# Patient Record
Sex: Female | Born: 1955 | Race: White | Hispanic: No | Smoking: Never smoker
Health system: Southern US, Community
[De-identification: ages and names within clinical notes are randomized; demographics above are authoritative.]

## PROBLEM LIST (undated history)

## (undated) DIAGNOSIS — C801 Malignant (primary) neoplasm, unspecified: Secondary | ICD-10-CM

## (undated) DIAGNOSIS — E119 Type 2 diabetes mellitus without complications: Secondary | ICD-10-CM

## (undated) DIAGNOSIS — E7800 Pure hypercholesterolemia, unspecified: Secondary | ICD-10-CM

## (undated) DIAGNOSIS — I10 Essential (primary) hypertension: Secondary | ICD-10-CM

## (undated) DIAGNOSIS — D649 Anemia, unspecified: Secondary | ICD-10-CM

## (undated) DIAGNOSIS — T753XXA Motion sickness, initial encounter: Secondary | ICD-10-CM

## (undated) DIAGNOSIS — I499 Cardiac arrhythmia, unspecified: Secondary | ICD-10-CM

## (undated) DIAGNOSIS — K859 Acute pancreatitis without necrosis or infection, unspecified: Secondary | ICD-10-CM

## (undated) DIAGNOSIS — K219 Gastro-esophageal reflux disease without esophagitis: Secondary | ICD-10-CM

## (undated) HISTORY — PX: ACHILLES TENDON REPAIR: SUR1153

## (undated) HISTORY — PX: CARPAL TUNNEL RELEASE: SHX101

## (undated) HISTORY — PX: BREAST BIOPSY: SHX20

## (undated) HISTORY — PX: CATARACT EXTRACTION, BILATERAL: SHX1313

## (undated) HISTORY — PX: OTHER SURGICAL HISTORY: SHX169

## (undated) HISTORY — PX: BREAST EXCISIONAL BIOPSY: SUR124

## (undated) HISTORY — PX: TONSILLECTOMY: SUR1361

---

## 2004-12-15 ENCOUNTER — Ambulatory Visit: Payer: Self-pay

## 2006-01-09 ENCOUNTER — Ambulatory Visit: Payer: Self-pay | Admitting: Obstetrics and Gynecology

## 2007-01-17 ENCOUNTER — Ambulatory Visit: Payer: Self-pay | Admitting: Obstetrics and Gynecology

## 2008-01-29 ENCOUNTER — Ambulatory Visit: Payer: Self-pay | Admitting: Obstetrics and Gynecology

## 2009-04-16 ENCOUNTER — Ambulatory Visit: Payer: Self-pay | Admitting: Obstetrics and Gynecology

## 2009-04-23 ENCOUNTER — Ambulatory Visit: Payer: Self-pay | Admitting: Obstetrics and Gynecology

## 2009-11-03 ENCOUNTER — Ambulatory Visit: Payer: Self-pay | Admitting: Obstetrics and Gynecology

## 2010-04-22 ENCOUNTER — Ambulatory Visit: Payer: Self-pay | Admitting: Obstetrics and Gynecology

## 2010-06-23 ENCOUNTER — Ambulatory Visit: Payer: Self-pay | Admitting: Anesthesiology

## 2010-06-28 ENCOUNTER — Ambulatory Visit: Payer: Self-pay | Admitting: Unknown Physician Specialty

## 2011-05-05 ENCOUNTER — Ambulatory Visit: Payer: Self-pay | Admitting: Obstetrics and Gynecology

## 2012-05-08 ENCOUNTER — Ambulatory Visit: Payer: Self-pay | Admitting: Obstetrics and Gynecology

## 2013-05-21 ENCOUNTER — Ambulatory Visit: Payer: Self-pay | Admitting: Obstetrics and Gynecology

## 2013-07-19 DIAGNOSIS — I10 Essential (primary) hypertension: Secondary | ICD-10-CM | POA: Insufficient documentation

## 2013-07-19 DIAGNOSIS — E7800 Pure hypercholesterolemia, unspecified: Secondary | ICD-10-CM | POA: Insufficient documentation

## 2013-07-19 DIAGNOSIS — E119 Type 2 diabetes mellitus without complications: Secondary | ICD-10-CM | POA: Insufficient documentation

## 2014-07-11 ENCOUNTER — Ambulatory Visit: Payer: Self-pay | Admitting: Obstetrics and Gynecology

## 2015-05-22 ENCOUNTER — Ambulatory Visit
Admission: EM | Admit: 2015-05-22 | Discharge: 2015-05-22 | Disposition: A | Discharge status: Home / Self Care | Payer: BC Managed Care – PPO | Attending: Family Medicine | Admitting: Family Medicine

## 2015-05-22 ENCOUNTER — Emergency Department
Admission: EM | Admit: 2015-05-22 | Discharge: 2015-05-22 | Disposition: A | Discharge status: Home / Self Care | Payer: BC Managed Care – PPO | Attending: Emergency Medicine | Admitting: Emergency Medicine

## 2015-05-22 ENCOUNTER — Encounter: Payer: Self-pay | Admitting: Emergency Medicine

## 2015-05-22 ENCOUNTER — Emergency Department: Payer: BC Managed Care – PPO

## 2015-05-22 DIAGNOSIS — K219 Gastro-esophageal reflux disease without esophagitis: Secondary | ICD-10-CM | POA: Insufficient documentation

## 2015-05-22 DIAGNOSIS — E119 Type 2 diabetes mellitus without complications: Secondary | ICD-10-CM | POA: Diagnosis not present

## 2015-05-22 DIAGNOSIS — R079 Chest pain, unspecified: Secondary | ICD-10-CM | POA: Diagnosis not present

## 2015-05-22 DIAGNOSIS — I10 Essential (primary) hypertension: Secondary | ICD-10-CM | POA: Diagnosis not present

## 2015-05-22 HISTORY — DX: Type 2 diabetes mellitus without complications: E11.9

## 2015-05-22 HISTORY — DX: Essential (primary) hypertension: I10

## 2015-05-22 HISTORY — DX: Gastro-esophageal reflux disease without esophagitis: K21.9

## 2015-05-22 LAB — CBC
HEMATOCRIT: 39.2 % (ref 35.0–47.0)
HEMOGLOBIN: 13.3 g/dL (ref 12.0–16.0)
MCH: 31.7 pg (ref 26.0–34.0)
MCHC: 34 g/dL (ref 32.0–36.0)
MCV: 93.1 fL (ref 80.0–100.0)
Platelets: 179 10*3/uL (ref 150–440)
RBC: 4.21 MIL/uL (ref 3.80–5.20)
RDW: 14.1 % (ref 11.5–14.5)
WBC: 7.1 10*3/uL (ref 3.6–11.0)

## 2015-05-22 LAB — BASIC METABOLIC PANEL
ANION GAP: 7 (ref 5–15)
BUN: 19 mg/dL (ref 6–20)
CHLORIDE: 105 mmol/L (ref 101–111)
CO2: 26 mmol/L (ref 22–32)
Calcium: 9.3 mg/dL (ref 8.9–10.3)
Creatinine, Ser: 0.71 mg/dL (ref 0.44–1.00)
GFR calc non Af Amer: >60 mL/min (ref >60)
GLUCOSE: 102 mg/dL — AB (ref 65–99)
Potassium: 3.5 mmol/L (ref 3.5–5.1)
Sodium: 138 mmol/L (ref 135–145)

## 2015-05-22 LAB — TROPONIN I: Troponin I: <0.03 ng/mL (ref <0.031)

## 2015-05-22 MED ORDER — ASPIRIN 81 MG PO CHEW
324.0000 mg | CHEWABLE_TABLET | Freq: Once | ORAL | Status: AC
  Administered 2015-05-22: 324 mg via ORAL

## 2015-05-22 MED ORDER — ASPIRIN 81 MG PO TBEC: 81.0000 mg | DELAYED_RELEASE_TABLET | Freq: Every day | ORAL | Status: AC

## 2015-05-22 NOTE — ED Provider Notes (Signed)
[facility] Emergency Department Provider Note     Time seen: ----------------------------------------- 4:17 PM on 05/22/2015 -----------------------------------------    I have reviewed the triage vital signs and the nursing notes.   HISTORY  Chief Complaint Chest Pain    HPI Nancy Fowler is a 60 y.o. female who presents ER with central chest pain that occurred today around noon and lasted 25 minutes. Patient states she's had recurring episodes like this for the last year but noticed since worsened. Patient states she had the pain twice yesterday and today's lasted longer than normal. Pain doesn't radiate, she has no other associated symptoms. She does take omeprazole daily.   Past Medical History  Diagnosis Date  . Diabetes mellitus without complication ([place])   . Hypertension   . GERD (gastroesophageal reflux disease)     There are no active problems to display for this patient.   History reviewed. No pertinent past surgical history.  Allergies Review of patient's allergies indicates no known allergies.  Social History Social History  Substance Use Topics  . Smoking status: Never Smoker   . Smokeless tobacco: None  . Alcohol Use: No    Review of Systems Constitutional: Negative for fever. Eyes: Negative for visual changes. ENT: Negative for sore throat. Cardiovascular: Positive for chest pain Respiratory: Negative for shortness of breath. Gastrointestinal: Negative for abdominal pain, vomiting and diarrhea. Genitourinary: Negative for dysuria. Musculoskeletal: Negative for back pain. Skin: Negative for rash. Neurological: Negative for headaches, focal weakness or numbness.  10-point ROS otherwise negative.  ____________________________________________   PHYSICAL EXAM:  VITAL SIGNS: ED Triage Vitals  Enc Vitals Group     BP 05/22/15 1425 138/61 mmHg     Pulse Rate 05/22/15 1425 76     Resp 05/22/15 1425 18     Temp  05/22/15 1425 98 F (36.7 C)     Temp Source 05/22/15 1425 Oral     SpO2 05/22/15 1425 97 %     Weight 05/22/15 1425 200 lb (90.719 kg)     Height 05/22/15 1425 5\' 4"  (1.626 m)     Head Cir --      Peak Flow --      Pain Score 05/22/15 1436 0     Pain Loc --      Pain Edu? --      Excl. in [place]? --     Constitutional: Alert and oriented. Well appearing and in no distress. Eyes: Conjunctivae are normal. PERRL. Normal extraocular movements. ENT   Head: Normocephalic and atraumatic.   Nose: No congestion/rhinnorhea.   Mouth/Throat: Mucous membranes are moist.   Neck: No stridor. Cardiovascular: Normal rate, regular rhythm. Normal and symmetric distal pulses are present in all extremities. No murmurs, rubs, or gallops. Respiratory: Normal respiratory effort without tachypnea nor retractions. Breath sounds are clear and equal bilaterally. No wheezes/rales/rhonchi. Gastrointestinal: Soft and nontender. No distention. No abdominal bruits.  Musculoskeletal: Nontender with normal range of motion in all extremities. No joint effusions.  No lower extremity tenderness nor edema. Neurologic:  Normal speech and language. No gross focal neurologic deficits are appreciated. Speech is normal. No gait instability. Skin:  Skin is warm, dry and intact. No rash noted. Psychiatric: Mood and affect are normal. Speech and behavior are normal. Patient exhibits appropriate insight and judgment. ____________________________________________  EKG: Interpreted by me. Normal sinus rhythm with a rate of 69 bpm, normal PR interval, normal QRS, normal QT interval. Normal axis.  ____________________________________________  ED COURSE:  Pertinent labs &  imaging results that were available during my care of the patient were reviewed by me and considered in my medical decision making (see chart for details). Patient is in no acute distress, will check cardiac labs and x-ray and  reevaluate. ____________________________________________    LABS (pertinent positives/negatives)  Labs Reviewed  BASIC METABOLIC PANEL - Abnormal; Notable for the following:    Glucose, Bld 102 (*)    All other components within normal limits  CBC  TROPONIN I    RADIOLOGY  Chest x-ray is normal  ____________________________________________  FINAL ASSESSMENT AND PLAN  Chest pain  Plan: Patient with labs and imaging as dictated above. Patient is low risk according to heart score for ACS. We will advise her to double her omeprazole for the time being and had a baby aspirin daily so she can follow-up and have a stress test. She'll be referred to cardiology for outpatient follow-up.   Earleen Newport, MD   Earleen Newport, MD 05/22/15 1640

## 2015-05-22 NOTE — ED Notes (Signed)
Patient presents to the ED with central chest pain that occurred today around noon and lasted about 68minutes.  Patient states she has been having recurrent centralized chest pain for about a year but has noticed that frequency of chest pain has increased over the past few weeks.  Patient states she had the pain twice yesterday and today's episode lasted longer than usual.  Patient reports pain does not radiate and she does not have shortness of breath or diaphoresis, nausea, or vomiting alongside the pain.  Patient states she has thought the pain was heartburn in the past but has become concerned as episodes have increased.

## 2015-05-22 NOTE — Discharge Instructions (Signed)
Chest Pain Observation °It is often hard to give a specific diagnosis for the cause of chest pain. Among other possibilities your symptoms might be caused by inadequate oxygen delivery to your heart (angina). Angina that is not treated or evaluated can lead to a heart attack (myocardial infarction) or death. °Blood tests, electrocardiograms, and X-rays may have been done to help determine a possible cause of your chest pain. After evaluation and observation, your health care provider has determined that it is unlikely your pain was caused by an unstable condition that requires hospitalization. However, a full evaluation of your pain may need to be completed, with additional diagnostic testing as directed. It is very important to keep your follow-up appointments. Not keeping your follow-up appointments could result in permanent heart damage, disability, or death. If there is any problem keeping your follow-up appointments, you must call your health care provider. °HOME CARE INSTRUCTIONS  °Due to the slight chance that your pain could be angina, it is important to follow your health care provider's treatment plan and also maintain a healthy lifestyle: °· Maintain or work toward achieving a healthy weight. °· Stay physically active and exercise regularly. °· Decrease your salt intake. °· Eat a balanced, healthy diet. Talk to a dietitian to learn about heart-healthy foods. °· Increase your fiber intake by including whole grains, vegetables, fruits, and nuts in your diet. °· Avoid situations that cause stress, anger, or depression. °· Take medicines as advised by your health care provider. Report any side effects to your health care provider. Do not stop medicines or adjust the dosages on your own. °· Quit smoking. Do not use nicotine patches or gum until you check with your health care provider. °· Keep your blood pressure, blood sugar, and cholesterol levels within normal limits. °· Limit alcohol intake to no more than  1 drink per day for women who are not pregnant and 2 drinks per day for men. °· Do not abuse drugs. °SEEK IMMEDIATE MEDICAL CARE IF: °You have severe chest pain or pressure which may include symptoms such as: °· You feel pain or pressure in your arms, neck, jaw, or back. °· You have severe back or abdominal pain, feel sick to your stomach (nauseous), or throw up (vomit). °· You are sweating profusely. °· You are having a fast or irregular heartbeat. °· You feel short of breath while at rest. °· You notice increasing shortness of breath during rest, sleep, or with activity. °· You have chest pain that does not get better after rest or after taking your usual medicine. °· You wake from sleep with chest pain. °· You are unable to sleep because you cannot breathe. °· You develop a frequent cough or you are coughing up blood. °· You feel dizzy, faint, or experience extreme fatigue. °· You develop severe weakness, dizziness, fainting, or chills. °Any of these symptoms may represent a serious problem that is an emergency. Do not wait to see if the symptoms will go away. Call your local emergency services (911 in the [place] Do not drive yourself to the hospital. °MAKE SURE YOU: °· Understand these instructions. °· Will watch your condition. °· Will get help right away if you are not doing well or get worse. °  °This information is not intended to replace advice given to you by your health care provider. Make sure you discuss any questions you have with your health care provider. °  °Document Released: 05/14/2010 Document Revised: 04/16/2013 Document Reviewed: 10/11/2012 °Elsevier Interactive Patient   Education 2016 [employer].  CK-MB Creatine kinase (CK) is an enzyme found in your muscle tissues. When your muscle tissue is damaged, CK is released into your blood. Creatine kinase-myocardial band (CK-MB) is a type of CK that is found mostly in heart muscle.  Your health care provider may order a CK-MB blood test to  determine if you have heart muscle damage caused by a heart attack. This test requires a blood sample from a vein in your hand or arm. CK-MB may become elevated as soon as 3-6 hours after you notice chest pain from a heart attack. A CK-MB test alone is not enough to diagnose a heart attack. Other conditions can also cause CK-MB to go up. You may also have other tests to help your health care provider make a diagnosis. CK-MB may often be done along with a total CK blood test.The CK-MB test may be repeated after a few hours or days. This test may also be referred to as creatine phosphokinase-myocardial band (CPK-MB). PREPARATION FOR TEST You may have this test as part of an emergency evaluation of your heart. Several other conditions and situations can cause CK-MB to go up. Tell your health care provider about:  Medicines you take.  Recent surgeries.  Recent muscle injury or heavy exercise.  Kidney disease.  Use of cocaine.  Heavy alcohol use. RESULTS It is your responsibility to obtain your test results. Ask the lab or department performing the test when and how you will get your results. Contact your health care provider to discuss any questions you have about your results.  Range of Normal Values Ranges for normal values vary among different labs and hospitals. You should always check with your health care provider after having lab work or other tests done to discuss whether your values are considered within normal limits. CK-MB levels can also vary widely between:   Men and women.  Different people.  The type of lab test used. The normal value for CK-MB should be 0%, so any level of CK-MB may be abnormal. If your CK-MB is 15-30% of your total CK, it may mean that you have recently had a heart attack.  Meaning of Results Outside Normal Range Values Several other conditions can cause CK-MB to be outside the normal range. Your CK-MB level may be higher than normal if you have an injury  to your skeletal muscles. High blood pressure and kidney disease can also cause CK-MB to go up. Sometimes vigorous exercise will increase CK-MB. In rare cases, a high level of CK-MB can be from low levels of thyroid hormone, alcohol abuse, or chronic muscle disease.  Your health care provider will use your CK-MB blood test results along with other tests and exams to make a diagnosis.   This information is not intended to replace advice given to you by your health care provider. Make sure you discuss any questions you have with your health care provider.   Document Released: 05/04/2004 Document Revised: 05/02/2014 Document Reviewed: 07/10/2013 Elsevier Interactive Patient Education 2016 Else[employer] Inc.  Electrocardiography Electrocardiography is a test to check the heart. It looks at how your heart beats. It is done if:   You are having a heart attack.  You may have had a heart attack in the past.  You are having a health checkup. PROCEDURE  This test is easy and painless.  You will remove your clothes from the waist up, wear a hospital gown, and lie down on your back.  Small round  pads (electrodes) will be placed on your chest, arms, and legs. The round pads are attached to wires that go to a machine. The machine records the electrical activity of your heart.  You will be asked to relax and lie very still. AFTER THE PROCEDURE  If the test was done as part of a routine exam, you can go back to your normal activity as told by your doctor.  Your results will be looked at by a heart doctor (cardiologist).  Ask when your test results will be ready. Make sure you get your test results.   This information is not intended to replace advice given to you by your health care provider. Make sure you discuss any questions you have with your health care provider.   Document Released: 03/24/2008 Document Revised: 05/02/2014 Document Reviewed: 08/22/2011 Elsevier Interactive Patient Education  2016 [employer].  How to Take Your Blood Pressure HOW DO I GET A BLOOD PRESSURE MACHINE?  You can buy an electronic home blood pressure machine at your local pharmacy. Insurance will sometimes cover the cost if you have a prescription.  Ask your doctor what type of machine is best for you. There are different machines for your arm and your wrist.  If you decide to buy a machine to check your blood pressure on your arm, first check the size of your arm so you can buy the right size cuff. To check the size of your arm:   Use a measuring tape that shows both inches and centimeters.   Wrap the measuring tape around the upper-middle part of your arm. You may need someone to help you measure.   Write down your arm measurement in both inches and centimeters.   To measure your blood pressure correctly, it is important to have the right size cuff.   If your arm is up to 13 inches (up to 34 centimeters), get an adult cuff size.  If your arm is 13 to 17 inches (35 to 44 centimeters), get a large adult cuff size.    If your arm is 17 to 20 inches (45 to 52 centimeters), get an adult thigh cuff.  WHAT DO THE NUMBERS MEAN?   There are two numbers that make up your blood pressure. For example: 120/80.  The first number (120 in our example) is called the "systolic pressure." It is a measure of the pressure in your blood vessels when your heart is pumping blood.  The second number (80 in our example) is called the "diastolic pressure." It is a measure of the pressure in your blood vessels when your heart is resting between beats.  Your doctor will tell you what your blood pressure should be. WHAT SHOULD I DO BEFORE I CHECK MY BLOOD PRESSURE?   Try to rest or relax for at least 30 minutes before you check your blood pressure.  Do not smoke.  Do not have any drinks with caffeine, such as:  Soda.  Coffee.  Tea.  Check your blood pressure in a quiet room.  Sit down and stretch  out your arm on a table. Keep your arm at about the level of your heart. Let your arm relax.  Make sure that your legs are not crossed. HOW DO I CHECK MY BLOOD PRESSURE?  Follow the directions that came with your machine.  Make sure you remove any tight-fitting clothing from your arm or wrist. Wrap the cuff around your upper arm or wrist. You should be able to fit a  finger between the cuff and your arm. If you cannot fit a finger between the cuff and your arm, it is too tight and should be removed and rewrapped.  Some units require you to manually pump up the arm cuff.  Automatic units inflate the cuff when you press a button.  Cuff deflation is automatic in both models.  After the cuff is inflated, the unit measures your blood pressure and pulse. The readings are shown on a monitor. Hold still and breathe normally while the cuff is inflated.  Getting a reading takes less than a minute.  Some models store readings in a memory. Some provide a printout of readings. If your machine does not store your readings, keep a written record.  Take readings with you to your next visit with your doctor.   This information is not intended to replace advice given to you by your health care provider. Make sure you discuss any questions you have with your health care provider.   Document Released: 03/24/2008 Document Revised: 05/02/2014 Document Reviewed: 06/06/2013 Elsevier Interactive Patient Education [employer].

## 2015-05-22 NOTE — ED Notes (Signed)
States had episode of mid sternal non radiating chest pain lasting 5 minutes. Today at noon sudden onset of same pain. Denies sweating and no SOB. Also has hx of GERD. Color pink, skin warm and dry to touch. Pain free on arrival to assigned room

## 2015-05-22 NOTE — Discharge Instructions (Signed)
Nonspecific Chest Pain  °Chest pain can be caused by many different conditions. There is always a chance that your pain could be related to something serious, such as a heart attack or a blood clot in your lungs. Chest pain can also be caused by conditions that are not life-threatening. If you have chest pain, it is very important to follow up with your health care provider. °CAUSES  °Chest pain can be caused by: °· Heartburn. °· Pneumonia or bronchitis. °· Anxiety or stress. °· Inflammation around your heart (pericarditis) or lung (pleuritis or pleurisy). °· A blood clot in your lung. °· A collapsed lung (pneumothorax). It can develop suddenly on its own (spontaneous pneumothorax) or from trauma to the chest. °· Shingles infection (varicella-zoster virus). °· Heart attack. °· Damage to the bones, muscles, and cartilage that make up your chest wall. This can include: °¨ Bruised bones due to injury. °¨ Strained muscles or cartilage due to frequent or repeated coughing or overwork. °¨ Fracture to one or more ribs. °¨ Sore cartilage due to inflammation (costochondritis). °RISK FACTORS  °Risk factors for chest pain may include: °· Activities that increase your risk for trauma or injury to your chest. °· Respiratory infections or conditions that cause frequent coughing. °· Medical conditions or overeating that can cause heartburn. °· Heart disease or family history of heart disease. °· Conditions or health behaviors that increase your risk of developing a blood clot. °· Having had chicken pox (varicella zoster). °SIGNS AND SYMPTOMS °Chest pain can feel like: °· Burning or tingling on the surface of your chest or deep in your chest. °· Crushing, pressure, aching, or squeezing pain. °· Dull or sharp pain that is worse when you move, cough, or take a deep breath. °· Pain that is also felt in your back, neck, shoulder, or arm, or pain that spreads to any of these areas. °Your chest pain may come and go, or it may stay  constant. °DIAGNOSIS °Lab tests or other studies may be needed to find the cause of your pain. Your health care provider may have you take a test called an ambulatory ECG (electrocardiogram). An ECG records your heartbeat patterns at the time the test is performed. You may also have other tests, such as: °· Transthoracic echocardiogram (TTE). During echocardiography, sound waves are used to create a picture of all of the heart structures and to look at how blood flows through your heart. °· Transesophageal echocardiogram (TEE). This is a more advanced imaging test that obtains images from inside your body. It allows your health care provider to see your heart in finer detail. °· Cardiac monitoring. This allows your health care provider to monitor your heart rate and rhythm in real time. °· Holter monitor. This is a portable device that records your heartbeat and can help to diagnose abnormal heartbeats. It allows your health care provider to track your heart activity for several days, if needed. °· Stress tests. These can be done through exercise or by taking medicine that makes your heart beat more quickly. °· Blood tests. °· Imaging tests. °TREATMENT  °Your treatment depends on what is causing your chest pain. Treatment may include: °· Medicines. These may include: °¨ Acid blockers for heartburn. °¨ Anti-inflammatory medicine. °¨ Pain medicine for inflammatory conditions. °¨ Antibiotic medicine, if an infection is present. °¨ Medicines to dissolve blood clots. °¨ Medicines to treat coronary artery disease. °· Supportive care for conditions that do not require medicines. This may include: °¨ Resting. °¨ Applying heat   or cold packs to injured areas. °¨ Limiting activities until pain decreases. °HOME CARE INSTRUCTIONS °· If you were prescribed an antibiotic medicine, finish it all even if you start to feel better. °· Avoid any activities that bring on chest pain. °· Do not use any tobacco products, including  cigarettes, chewing tobacco, or electronic cigarettes. If you need help quitting, ask your health care provider. °· Do not drink alcohol. °· Take medicines only as directed by your health care provider. °· Keep all follow-up visits as directed by your health care provider. This is important. This includes any further testing if your chest pain does not go away. °· If heartburn is the cause for your chest pain, you may be told to keep your head raised (elevated) while sleeping. This reduces the chance that acid will go from your stomach into your esophagus. °· Make lifestyle changes as directed by your health care provider. These may include: °¨ Getting regular exercise. Ask your health care provider to suggest some activities that are safe for you. °¨ Eating a heart-healthy diet. A registered dietitian can help you to learn healthy eating options. °¨ Maintaining a healthy weight. °¨ Managing diabetes, if necessary. °¨ Reducing stress. °SEEK MEDICAL CARE IF: °· Your chest pain does not go away after treatment. °· You have a rash with blisters on your chest. °· You have a fever. °SEEK IMMEDIATE MEDICAL CARE IF:  °· Your chest pain is worse. °· You have an increasing cough, or you cough up blood. °· You have severe abdominal pain. °· You have severe weakness. °· You faint. °· You have chills. °· You have sudden, unexplained chest discomfort. °· You have sudden, unexplained discomfort in your arms, back, neck, or jaw. °· You have shortness of breath at any time. °· You suddenly start to sweat, or your skin gets clammy. °· You feel nauseous or you vomit. °· You suddenly feel light-headed or dizzy. °· Your heart begins to beat quickly, or it feels like it is skipping beats. °These symptoms may represent a serious problem that is an emergency. Do not wait to see if the symptoms will go away. Get medical help right away. Call your local emergency services (911 in the [place] Do not drive yourself to the hospital. °  °This  information is not intended to replace advice given to you by your health care provider. Make sure you discuss any questions you have with your health care provider. °  °Document Released: 01/19/2005 Document Revised: 05/02/2014 Document Reviewed: 11/15/2013 °Elsevier Interactive Patient Education ©2016 Elsevier Inc. ° °

## 2015-05-22 NOTE — ED Notes (Signed)
Dr. Alveta Heimlich called [facility] ER and spoke with Roswell Miners RN and gave her report

## 2015-05-22 NOTE — ED Provider Notes (Signed)
CSN: [id number]     Arrival date & time 05/22/15  1243 History   First MD Initiated Contact with Patient 05/22/15 1312    Nurses notes were reviewed. Chief Complaint  Patient presents with  . Chest Pain    Patient's 60-year-old white female who presents because of chest pain that was quite different from her usual reflux discomfort yesterday before she ate then followed by reflux and then today at work while sitting she had another episode pain and no reflux at all. The pain is sharp pain along the chest wall does not radiate to breath away when the pain hit. Should be noted patient states that she's had reflux for about a year she stubbed been diagnosed she started taking PPIs which seemed to help initially but she's had increased breakthrough as far as his reflux discomfort and some mild chest discomfort but yesterday and today was much more intense chest discomfort that she's had before. She also reports she does not smoke she does have history of hypertension diabetes and she is hyperlipidemic medication.  She reports mother in her 41s had a major MI and died from that one MI that she had. Unfortunately last EKG was several years ago.     (Consider location/radiation/quality/duration/timing/severity/associated sxs/prior Treatment) Patient is a 60 y.o. female presenting with chest pain. The history is provided by the patient. No language interpreter was used.  Chest Pain Pain location:  Substernal area Pain quality: burning, dull, pressure, shooting and stabbing   Pain quality: not radiating   Pain radiates to:  Does not radiate Pain severity:  Moderate Onset quality:  Sudden Timing:  Intermittent Chronicity:  New Context: stress   Context: not breathing, no drug use, not eating, no movement and not raising an arm   Relieved by:  Nothing Ineffective treatments:  Antacids Associated symptoms: shortness of breath and weakness     Past Medical History  Diagnosis Date  . Diabetes  mellitus without complication ([place])   . Hypertension   . GERD (gastroesophageal reflux disease)    History reviewed. No pertinent past surgical history. Family History  Problem Relation Age of Onset  . Heart attack Mother   . Cancer Father    Social History  Substance Use Topics  . Smoking status: Never Smoker   . Smokeless tobacco: None  . Alcohol Use: No   OB History    No data available     Review of Systems  Respiratory: Positive for shortness of breath.   Cardiovascular: Positive for chest pain.  Neurological: Positive for weakness.  All other systems reviewed and are negative.   Allergies  Review of patient's allergies indicates no known allergies.  Home Medications   Prior to Admission medications   Medication Sig Start Date End Date Taking? Authorizing Provider  atorvastatin (LIPITOR) 10 MG tablet Take 10 mg by mouth daily.   Yes Historical Provider, MD  glimepiride (AMARYL) 1 MG tablet Take 1 mg by mouth daily with breakfast.   Yes Historical Provider, MD  losartan-hydrochlorothiazide (HYZAAR) 100-25 MG tablet Take 1 tablet by mouth daily.   Yes Historical Provider, MD   Meds Ordered and Administered this Visit   Medications  aspirin chewable tablet 324 mg (324 mg Oral Given 05/22/15 1310)    BP 138/67 mmHg  Pulse 72  Temp(Src) 98.8 F (37.1 C) (Tympanic)  Resp 16  Ht 5\' 4"  (1.626 m)  Wt 200 lb (90.719 kg)  BMI 34.31 kg/m2  SpO2 99% No data found.  Physical Exam  Constitutional: She appears well-developed and well-nourished.  HENT:  Head: Normocephalic and atraumatic.  Eyes: Conjunctivae are normal. Pupils are equal, round, and reactive to light.  Neck: Normal range of motion. Neck supple. No thyromegaly present.  Cardiovascular: Normal rate and regular rhythm.  Exam reveals distant heart sounds.   No murmur heard. Pulmonary/Chest: Effort normal and breath sounds normal.  Abdominal: Soft. Bowel sounds are normal.  Musculoskeletal: Normal  range of motion.  Neurological: She is alert.  Skin: Skin is warm.  Psychiatric: She has a normal mood and affect. Her behavior is normal.  Vitals reviewed.   ED Course  Procedures (including critical care time)  Labs Review Labs Reviewed - No data to display  Imaging Review No results found.   Visual Acuity Review  Right Eye Distance:   Left Eye Distance:   Bilateral Distance:    Right Eye Near:   Left Eye Near:    Bilateral Near:         MDM   1. Chest pain, unspecified chest pain type   2. Essential hypertension    Patient will be transferred by EMS to [facility] ED for evaluation of chest pain and to rule out myocardial infarction or ischemic heart disease. Discussed with charge nurse Myriam Jacobson.  Patient was given 4 baby aspirins here before she left. ED ECG REPORT I, Laree Garron H, the attending physician, personally viewed and interpreted this ECG.   Date: 05/22/2015  EKG Time: 12:56:11  Rhythm: there are no previous tracings available for comparison, nonspecific ST and T waves changes and complete right bundle branch block sinus rhythm is present   Axis: 61  Intervals:Patient with prolonged QT interval  ST&T Change:nonspecific ST-T wave changes and T-wave abnormalities  Patient abnormal EKG incomplete right bundle branch block and nonspecific ST-T wave abnormalities and prolonged QT.  Note: This dictation was prepared with Dragon dictation along with smaller phrase technology. Any transcriptional errors that result from this process are unintentional.   Frederich Cha, MD 05/22/15 1351

## 2015-07-24 ENCOUNTER — Other Ambulatory Visit: Payer: Self-pay | Admitting: Obstetrics and Gynecology

## 2015-07-24 DIAGNOSIS — Z1231 Encounter for screening mammogram for malignant neoplasm of breast: Secondary | ICD-10-CM

## 2015-08-06 ENCOUNTER — Ambulatory Visit: Payer: BC Managed Care – PPO

## 2015-08-13 ENCOUNTER — Ambulatory Visit: Payer: BC Managed Care – PPO

## 2015-08-25 ENCOUNTER — Ambulatory Visit
Admission: RE | Admit: 2015-08-25 | Discharge: 2015-08-25 | Disposition: A | Discharge status: Home / Self Care | Payer: BC Managed Care – PPO | Source: Ambulatory Visit | Attending: Obstetrics and Gynecology | Admitting: Obstetrics and Gynecology

## 2015-08-25 DIAGNOSIS — Z1231 Encounter for screening mammogram for malignant neoplasm of breast: Secondary | ICD-10-CM | POA: Insufficient documentation

## 2015-10-14 DIAGNOSIS — K219 Gastro-esophageal reflux disease without esophagitis: Secondary | ICD-10-CM | POA: Insufficient documentation

## 2016-04-25 DIAGNOSIS — N183 Chronic kidney disease, stage 3 unspecified: Secondary | ICD-10-CM | POA: Insufficient documentation

## 2016-07-15 ENCOUNTER — Other Ambulatory Visit
Admission: RE | Admit: 2016-07-15 | Discharge: 2016-07-15 | Disposition: A | Discharge status: Home / Self Care | Payer: BC Managed Care – PPO | Source: Ambulatory Visit | Attending: Gastroenterology | Admitting: Gastroenterology

## 2016-07-15 DIAGNOSIS — R197 Diarrhea, unspecified: Secondary | ICD-10-CM | POA: Insufficient documentation

## 2016-07-15 LAB — GASTROINTESTINAL PANEL BY PCR, STOOL (REPLACES STOOL CULTURE)

## 2016-07-15 LAB — C DIFFICILE QUICK SCREEN W PCR REFLEX
C DIFFICILE (CDIFF) TOXIN: NEGATIVE
C Diff antigen: POSITIVE — AB

## 2016-07-15 LAB — CLOSTRIDIUM DIFFICILE BY PCR: CDIFFPCR: POSITIVE — AB

## 2016-08-04 ENCOUNTER — Other Ambulatory Visit: Payer: Self-pay | Admitting: Obstetrics and Gynecology

## 2016-08-04 DIAGNOSIS — Z1239 Encounter for other screening for malignant neoplasm of breast: Secondary | ICD-10-CM

## 2016-09-14 DIAGNOSIS — K859 Acute pancreatitis without necrosis or infection, unspecified: Secondary | ICD-10-CM | POA: Insufficient documentation

## 2016-09-16 ENCOUNTER — Ambulatory Visit: Payer: BC Managed Care – PPO

## 2016-09-20 ENCOUNTER — Inpatient Hospital Stay: Admission: RE | Admit: 2016-09-20 | Payer: BC Managed Care – PPO | Source: Ambulatory Visit

## 2016-09-27 ENCOUNTER — Ambulatory Visit
Admission: RE | Admit: 2016-09-27 | Discharge: 2016-09-27 | Disposition: A | Discharge status: Home / Self Care | Payer: BC Managed Care – PPO | Source: Ambulatory Visit | Attending: Obstetrics and Gynecology | Admitting: Obstetrics and Gynecology

## 2016-09-27 DIAGNOSIS — Z1231 Encounter for screening mammogram for malignant neoplasm of breast: Secondary | ICD-10-CM | POA: Diagnosis not present

## 2016-09-27 DIAGNOSIS — Z1239 Encounter for other screening for malignant neoplasm of breast: Secondary | ICD-10-CM

## 2016-09-27 HISTORY — DX: Malignant (primary) neoplasm, unspecified: C80.1

## 2016-12-20 DIAGNOSIS — D649 Anemia, unspecified: Secondary | ICD-10-CM | POA: Insufficient documentation

## 2017-08-10 ENCOUNTER — Other Ambulatory Visit: Payer: Self-pay | Admitting: Obstetrics and Gynecology

## 2017-08-10 DIAGNOSIS — Z1239 Encounter for other screening for malignant neoplasm of breast: Secondary | ICD-10-CM

## 2017-09-14 ENCOUNTER — Other Ambulatory Visit: Payer: Self-pay | Admitting: Obstetrics and Gynecology

## 2017-09-14 DIAGNOSIS — Z1231 Encounter for screening mammogram for malignant neoplasm of breast: Secondary | ICD-10-CM

## 2017-09-28 ENCOUNTER — Ambulatory Visit
Admission: RE | Admit: 2017-09-28 | Discharge: 2017-09-28 | Disposition: A | Discharge status: Home / Self Care | Payer: BC Managed Care – PPO | Source: Ambulatory Visit | Attending: Obstetrics and Gynecology | Admitting: Obstetrics and Gynecology

## 2017-09-28 ENCOUNTER — Other Ambulatory Visit: Payer: Self-pay | Admitting: Obstetrics and Gynecology

## 2017-09-28 DIAGNOSIS — Z1231 Encounter for screening mammogram for malignant neoplasm of breast: Secondary | ICD-10-CM | POA: Diagnosis not present

## 2018-03-26 DIAGNOSIS — R002 Palpitations: Secondary | ICD-10-CM | POA: Insufficient documentation

## 2018-04-24 DIAGNOSIS — I493 Ventricular premature depolarization: Secondary | ICD-10-CM | POA: Insufficient documentation

## 2018-10-04 ENCOUNTER — Other Ambulatory Visit: Payer: Self-pay | Admitting: Obstetrics and Gynecology

## 2018-10-04 ENCOUNTER — Other Ambulatory Visit: Payer: Self-pay | Admitting: Nurse Practitioner

## 2018-10-04 DIAGNOSIS — Z1231 Encounter for screening mammogram for malignant neoplasm of breast: Secondary | ICD-10-CM

## 2018-10-11 ENCOUNTER — Ambulatory Visit: Payer: BC Managed Care – PPO

## 2018-10-11 ENCOUNTER — Ambulatory Visit
Admission: RE | Admit: 2018-10-11 | Discharge: 2018-10-11 | Disposition: A | Discharge status: Home / Self Care | Payer: BC Managed Care – PPO | Source: Ambulatory Visit | Attending: Nurse Practitioner | Admitting: Nurse Practitioner

## 2018-10-11 ENCOUNTER — Other Ambulatory Visit: Payer: Self-pay

## 2018-10-11 DIAGNOSIS — Z1231 Encounter for screening mammogram for malignant neoplasm of breast: Secondary | ICD-10-CM | POA: Diagnosis not present

## 2019-06-30 ENCOUNTER — Ambulatory Visit: Payer: BC Managed Care – PPO | Attending: Internal Medicine

## 2019-06-30 DIAGNOSIS — Z23 Encounter for immunization: Secondary | ICD-10-CM

## 2019-06-30 NOTE — Progress Notes (Signed)
   Covid-19 Vaccination Clinic  Name:  Nancy Fowler    MRN: [id number] DOB: 1955-10-10  06/30/2019  [name] was observed post Covid-19 immunization for 15 minutes without incident. She was provided with Vaccine Information Sheet and instruction to access the V-Safe system.   [name] was instructed to call 911 with any severe reactions post vaccine: Marland Kitchen Difficulty breathing  . Swelling of face and throat  . A fast heartbeat  . A bad rash all over body  . Dizziness and weakness   Immunizations Administered    Name Date Dose VIS Date Route   Pfizer COVID-19 Vaccine 06/30/2019 12:45 PM 0.3 mL 04/05/2019 Intramuscular   Manufacturer: [place]   Lot: [id number]   [place]: [id number]

## 2019-07-24 ENCOUNTER — Other Ambulatory Visit: Payer: Self-pay

## 2019-07-24 ENCOUNTER — Ambulatory Visit: Payer: BC Managed Care – PPO | Attending: Internal Medicine

## 2019-07-24 DIAGNOSIS — Z23 Encounter for immunization: Secondary | ICD-10-CM

## 2019-07-24 NOTE — Progress Notes (Signed)
   Covid-19 Vaccination Clinic  Name:  Nancy Fowler    MRN: [id number] DOB: March 12, 1956  07/24/2019  [name] was observed post Covid-19 immunization for 15 minutes without incident. She was provided with Vaccine Information Sheet and instruction to access the V-Safe system.   [name] was instructed to call 911 with any severe reactions post vaccine: Marland Kitchen Difficulty breathing  . Swelling of face and throat  . A fast heartbeat  . A bad rash all over body  . Dizziness and weakness   Immunizations Administered    Name Date Dose VIS Date Route   Pfizer COVID-19 Vaccine 07/24/2019  2:30 PM 0.3 mL 04/05/2019 Intramuscular   Manufacturer: [place]   Lot: [id number]   [place]: [id number]

## 2019-09-04 ENCOUNTER — Other Ambulatory Visit: Payer: Self-pay | Admitting: Podiatry

## 2019-09-10 ENCOUNTER — Other Ambulatory Visit: Payer: BC Managed Care – PPO

## 2019-09-11 ENCOUNTER — Encounter: Payer: Self-pay | Admitting: Podiatry

## 2019-09-11 ENCOUNTER — Other Ambulatory Visit: Payer: Self-pay

## 2019-09-17 ENCOUNTER — Other Ambulatory Visit: Payer: Self-pay

## 2019-09-17 ENCOUNTER — Other Ambulatory Visit
Admission: RE | Admit: 2019-09-17 | Discharge: 2019-09-17 | Disposition: A | Discharge status: Home / Self Care | Payer: BC Managed Care – PPO | Source: Ambulatory Visit | Attending: Podiatry | Admitting: Podiatry

## 2019-09-17 DIAGNOSIS — Z01812 Encounter for preprocedural laboratory examination: Secondary | ICD-10-CM | POA: Diagnosis not present

## 2019-09-17 DIAGNOSIS — Z20822 Contact with and (suspected) exposure to covid-19: Secondary | ICD-10-CM | POA: Diagnosis not present

## 2019-09-17 LAB — SARS CORONAVIRUS 2 (TAT 6-24 HRS): SARS Coronavirus 2: NEGATIVE

## 2019-09-19 ENCOUNTER — Ambulatory Visit
Admission: RE | Admit: 2019-09-19 | Discharge: 2019-09-19 | Disposition: A | Discharge status: Home / Self Care | Payer: BC Managed Care – PPO | Attending: Podiatry | Admitting: Podiatry

## 2019-09-19 ENCOUNTER — Encounter
Admission: RE | Disposition: A | Discharge status: Home / Self Care | Payer: Self-pay | Source: Home / Self Care | Attending: Podiatry

## 2019-09-19 ENCOUNTER — Ambulatory Visit: Payer: BC Managed Care – PPO | Admitting: Anesthesiology

## 2019-09-19 ENCOUNTER — Encounter: Payer: Self-pay | Admitting: Podiatry

## 2019-09-19 ENCOUNTER — Other Ambulatory Visit: Payer: Self-pay

## 2019-09-19 DIAGNOSIS — I10 Essential (primary) hypertension: Secondary | ICD-10-CM | POA: Diagnosis not present

## 2019-09-19 DIAGNOSIS — E119 Type 2 diabetes mellitus without complications: Secondary | ICD-10-CM | POA: Diagnosis not present

## 2019-09-19 DIAGNOSIS — Z7982 Long term (current) use of aspirin: Secondary | ICD-10-CM | POA: Insufficient documentation

## 2019-09-19 DIAGNOSIS — Z79899 Other long term (current) drug therapy: Secondary | ICD-10-CM | POA: Diagnosis not present

## 2019-09-19 DIAGNOSIS — M7661 Achilles tendinitis, right leg: Secondary | ICD-10-CM | POA: Diagnosis not present

## 2019-09-19 DIAGNOSIS — M899 Disorder of bone, unspecified: Secondary | ICD-10-CM | POA: Insufficient documentation

## 2019-09-19 DIAGNOSIS — Z7984 Long term (current) use of oral hypoglycemic drugs: Secondary | ICD-10-CM | POA: Insufficient documentation

## 2019-09-19 DIAGNOSIS — M6701 Short Achilles tendon (acquired), right ankle: Secondary | ICD-10-CM | POA: Diagnosis not present

## 2019-09-19 DIAGNOSIS — E785 Hyperlipidemia, unspecified: Secondary | ICD-10-CM | POA: Insufficient documentation

## 2019-09-19 DIAGNOSIS — K219 Gastro-esophageal reflux disease without esophagitis: Secondary | ICD-10-CM | POA: Insufficient documentation

## 2019-09-19 HISTORY — DX: Acute pancreatitis without necrosis or infection, unspecified: K85.90

## 2019-09-19 HISTORY — DX: Cardiac arrhythmia, unspecified: I49.9

## 2019-09-19 HISTORY — PX: ACHILLES TENDON SURGERY: SHX542

## 2019-09-19 HISTORY — DX: Anemia, unspecified: D64.9

## 2019-09-19 HISTORY — PX: GASTROC RECESSION EXTREMITY: SHX6262

## 2019-09-19 HISTORY — DX: Pure hypercholesterolemia, unspecified: E78.00

## 2019-09-19 HISTORY — DX: Motion sickness, initial encounter: T75.3XXA

## 2019-09-19 HISTORY — PX: OSTECTOMY: SHX6439

## 2019-09-19 LAB — GLUCOSE, CAPILLARY
Glucose-Capillary: 89 mg/dL (ref 70–99)
Glucose-Capillary: 113 mg/dL — ABNORMAL HIGH (ref 70–99)

## 2019-09-19 SURGERY — REPAIR, TENDON, ACHILLES
Anesthesia: Regional | Site: Leg Lower | Laterality: Right

## 2019-09-19 MED ORDER — LACTATED RINGERS IV SOLN: INTRAVENOUS | Status: DC

## 2019-09-19 MED ORDER — ACETAMINOPHEN 325 MG PO TABS: 325.0000 mg | ORAL_TABLET | ORAL | Status: DC | PRN

## 2019-09-19 MED ORDER — GLYCOPYRROLATE 0.2 MG/ML IJ SOLN
INTRAMUSCULAR | Status: DC | PRN
  Administered 2019-09-19: .1 mg via INTRAVENOUS

## 2019-09-19 MED ORDER — SUCCINYLCHOLINE CHLORIDE 20 MG/ML IJ SOLN
INTRAMUSCULAR | Status: DC | PRN
  Administered 2019-09-19: 80 mg via INTRAVENOUS

## 2019-09-19 MED ORDER — ACETAMINOPHEN 160 MG/5ML PO SOLN: 325.0000 mg | ORAL | Status: DC | PRN

## 2019-09-19 MED ORDER — MIDAZOLAM HCL 5 MG/5ML IJ SOLN
INTRAMUSCULAR | Status: DC | PRN
  Administered 2019-09-19: 2 mg via INTRAVENOUS

## 2019-09-19 MED ORDER — CEFAZOLIN SODIUM-DEXTROSE 2-4 GM/100ML-% IV SOLN
2.0000 g | INTRAVENOUS | Status: AC
  Administered 2019-09-19: 2 g via INTRAVENOUS

## 2019-09-19 MED ORDER — PROPOFOL 10 MG/ML IV BOLUS
INTRAVENOUS | Status: DC | PRN
  Administered 2019-09-19: 120 mg via INTRAVENOUS

## 2019-09-19 MED ORDER — FENTANYL CITRATE (PF) 100 MCG/2ML IJ SOLN: 25.0000 ug | INTRAMUSCULAR | Status: DC | PRN

## 2019-09-19 MED ORDER — OXYCODONE HCL 5 MG/5ML PO SOLN: 5.0000 mg | Freq: Once | ORAL | Status: DC | PRN

## 2019-09-19 MED ORDER — OXYCODONE-ACETAMINOPHEN 7.5-325 MG PO TABS: 1.0000 | ORAL_TABLET | ORAL | 0 refills | Status: DC | PRN

## 2019-09-19 MED ORDER — LIDOCAINE HCL (CARDIAC) PF 100 MG/5ML IV SOSY
PREFILLED_SYRINGE | INTRAVENOUS | Status: DC | PRN
  Administered 2019-09-19: 40 mg via INTRAVENOUS

## 2019-09-19 MED ORDER — OXYCODONE HCL 5 MG PO TABS: 5.0000 mg | ORAL_TABLET | Freq: Once | ORAL | Status: DC | PRN

## 2019-09-19 MED ORDER — ONDANSETRON HCL 4 MG/2ML IJ SOLN
INTRAMUSCULAR | Status: DC | PRN
  Administered 2019-09-19: 4 mg via INTRAVENOUS

## 2019-09-19 MED ORDER — POVIDONE-IODINE 7.5 % EX SOLN: Freq: Once | CUTANEOUS | Status: AC

## 2019-09-19 MED ORDER — EPHEDRINE SULFATE 50 MG/ML IJ SOLN
INTRAMUSCULAR | Status: DC | PRN
  Administered 2019-09-19: 10 mg via INTRAVENOUS

## 2019-09-19 MED ORDER — FENTANYL CITRATE (PF) 100 MCG/2ML IJ SOLN
INTRAMUSCULAR | Status: DC | PRN
  Administered 2019-09-19 (×2): 50 ug via INTRAVENOUS

## 2019-09-19 MED ORDER — BUPIVACAINE LIPOSOME 1.3 % IJ SUSP
INTRAMUSCULAR | Status: DC | PRN
  Administered 2019-09-19: 266 mg

## 2019-09-19 MED ORDER — BUPIVACAINE HCL (PF) 0.5 % IJ SOLN
INTRAMUSCULAR | Status: DC | PRN
Start: 2019-09-19 — End: 2019-09-19
  Administered 2019-09-19: 20 mL

## 2019-09-19 SURGICAL SUPPLY — 39 items
ANCHOR JUGGERKNOT WTAP NDL 2.9 (Anchor) ×1 IMPLANT
BIT DRILL JUGRKNT W/NDL BIT2.9 (DRILL) IMPLANT
BLADE MED AGGRESSIVE (BLADE) ×1 IMPLANT
BLADE SURG 15 STRL LF DISP TIS (BLADE) IMPLANT
BLADE SURG 15 STRL SS (BLADE) ×2
BNDG ELASTIC 4X5.8 VLCR STR LF (GAUZE/BANDAGES/DRESSINGS) ×1 IMPLANT
BNDG ESMARK 4X12 TAN STRL LF (GAUZE/BANDAGES/DRESSINGS) ×3 IMPLANT
BNDG GAUZE 4.5X4.1 6PLY STRL (MISCELLANEOUS) ×3 IMPLANT
BNDG STRETCH 4X75 STRL LF (GAUZE/BANDAGES/DRESSINGS) ×3 IMPLANT
CANISTER SUCT 1200ML W/VALVE (MISCELLANEOUS) ×3 IMPLANT
COVER LIGHT HANDLE UNIVERSAL (MISCELLANEOUS) ×6 IMPLANT
CUFF TOURN SGL QUICK 30 (TOURNIQUET CUFF) ×1
CUFF TRNQT CYL 30X4X21-28X (TOURNIQUET CUFF) ×2 IMPLANT
DRILL JUGGERKNOT W/NDL BIT 2.9 (DRILL) ×3
DURAPREP 26ML APPLICATOR (WOUND CARE) ×3 IMPLANT
ELECT REM PT RETURN 9FT ADLT (ELECTROSURGICAL) ×3
ELECTRODE REM PT RTRN 9FT ADLT (ELECTROSURGICAL) ×2 IMPLANT
GAUZE SPONGE 4X4 12PLY STRL (GAUZE/BANDAGES/DRESSINGS) ×3 IMPLANT
GAUZE XEROFORM 1X8 LF (GAUZE/BANDAGES/DRESSINGS) ×3 IMPLANT
GLOVE BIO SURGEON STRL SZ8 (GLOVE) ×5 IMPLANT
GOWN STRL REUS W/ TWL LRG LVL3 (GOWN DISPOSABLE) ×2 IMPLANT
GOWN STRL REUS W/ TWL XL LVL3 (GOWN DISPOSABLE) ×2 IMPLANT
GOWN STRL REUS W/TWL LRG LVL3 (GOWN DISPOSABLE) ×1
GOWN STRL REUS W/TWL XL LVL3 (GOWN DISPOSABLE) ×1
KIT TURNOVER KIT A (KITS) ×3 IMPLANT
NS IRRIG 500ML POUR BTL (IV SOLUTION) ×3 IMPLANT
PACK EXTREMITY ARMC (MISCELLANEOUS) ×3 IMPLANT
PADDING CAST BLEND 4X4 NS (MISCELLANEOUS) ×9 IMPLANT
PENCIL SMOKE EVACUATOR (MISCELLANEOUS) ×3 IMPLANT
RASP SM TEAR CROSS CUT (RASP) ×1 IMPLANT
SPLINT CAST 1 STEP 4X30 (MISCELLANEOUS) ×3 IMPLANT
SPLINT FAST PLASTER 5X30 (CAST SUPPLIES) ×1
SPLINT PLASTER CAST FAST 5X30 (CAST SUPPLIES) ×2 IMPLANT
STOCKINETTE STRL 6IN 960660 (GAUZE/BANDAGES/DRESSINGS) ×3 IMPLANT
STRIP CLOSURE SKIN 1/4X4 (GAUZE/BANDAGES/DRESSINGS) ×3 IMPLANT
SUT VIC AB 3-0 SH 27 (SUTURE)
SUT VIC AB 3-0 SH 27X BRD (SUTURE) ×2 IMPLANT
SUT VIC AB 4-0 FS2 27 (SUTURE) ×3 IMPLANT
SWABSTK COMLB BENZOIN TINCTURE (MISCELLANEOUS) ×1 IMPLANT

## 2019-09-19 NOTE — Anesthesia Preprocedure Evaluation (Signed)
Anesthesia Evaluation  Patient identified by MRN, date of birth, ID band Patient awake    Reviewed: Allergy & Precautions, H&P , NPO status , Patient's Chart, lab work & pertinent test results  History of Anesthesia Complications Negative for: history of anesthetic complications  Airway Mallampati: II  TM Distance: >3 FB Neck ROM: full    Dental no notable dental hx.    Pulmonary neg pulmonary ROS,    Pulmonary exam normal        Cardiovascular hypertension, On Medications Normal cardiovascular exam Rhythm:regular Rate:Normal  Some PVCs   Neuro/Psych negative neurological ROS     GI/Hepatic Neg liver ROS, Medicated,  Endo/Other  diabetes, Well Controlled, Type 2  Renal/GU negative Renal ROS     Musculoskeletal   Abdominal   Peds  Hematology  (+) Blood dyscrasia, anemia ,   Anesthesia Other Findings   Reproductive/Obstetrics                             Anesthesia Physical Anesthesia Plan  ASA: II  Anesthesia Plan: General ETT and Regional   Post-op Pain Management: GA combined w/ Regional for post-op pain   Induction:   PONV Risk Score and Plan: 3 and Ondansetron and Midazolam  Airway Management Planned:   Additional Equipment:   Intra-op Plan:   Post-operative Plan:   Informed Consent:   Plan Discussed with:   Anesthesia Plan Comments:         Anesthesia Quick Evaluation

## 2019-09-19 NOTE — Anesthesia Procedure Notes (Signed)
Procedure Name: Intubation Date/Time: 09/19/2019 7:46 AM Performed by: Mayme Genta, CRNA Pre-anesthesia Checklist: Patient identified, Emergency Drugs available, Suction available, Patient being monitored and Timeout performed Patient Re-evaluated:Patient Re-evaluated prior to induction Oxygen Delivery Method: Circle system utilized Preoxygenation: Pre-oxygenation with 100% oxygen Induction Type: IV induction Ventilation: Mask ventilation without difficulty Laryngoscope Size: Miller and 2 Grade View: Grade I Tube type: Oral Tube size: 7.0 mm Number of attempts: 1 Placement Confirmation: ETT inserted through vocal cords under direct vision,  positive ETCO2 and breath sounds checked- equal and bilateral Secured at: 22 cm Tube secured with: Tape Dental Injury: Teeth and Oropharynx as per pre-operative assessment

## 2019-09-19 NOTE — Anesthesia Postprocedure Evaluation (Signed)
Anesthesia Post Note  Patient: Nancy Fowler [id number]  Procedure(s) Performed: ACHILLES REPAIR SECONDARY RIGHT (Right Foot) HAGLUNDS/RETROCALCANEAL (OSTECTOMY) RIGHT (Right Foot) GASTROC RECESSION EXTREMITY RIGHT (Right Leg Lower)     Patient location during evaluation: PACU Anesthesia Type: Regional Level of consciousness: awake and alert Pain management: pain level controlled Vital Signs Assessment: post-procedure vital signs reviewed and stable Respiratory status: spontaneous breathing Cardiovascular status: stable Anesthetic complications: no    Jaci Standard, III,  Nevaeh Casillas D

## 2019-09-19 NOTE — Anesthesia Procedure Notes (Signed)
Anesthesia Regional Block: Popliteal block   Pre-Anesthetic Checklist: ,, timeout performed, Correct Patient, Correct Site, Correct Laterality, Correct Procedure, Correct Position, site marked, Risks and benefits discussed,  Surgical consent,  Pre-op evaluation,  At surgeon's request and post-op pain management  Laterality: Right  Prep: chloraprep       Needles:  Injection technique: Single-shot  Needle Type: Echogenic Needle     Needle Length: 9cm  Needle Gauge: 21     Additional Needles:   Procedures:,,,, ultrasound used (permanent image in chart),,,,  Narrative:  Start time: 09/19/2019 6:55 AM End time: 09/19/2019 7:10 AM Injection made incrementally with aspirations every 5 mL.  Performed by: Personally  Anesthesiologist: Elgie Collard, MD  Additional Notes: Functioning IV was confirmed and monitors applied. Ultrasound guidance: relevant anatomy identified, needle position confirmed, local anesthetic spread visualized around nerve(s)., vascular puncture avoided.  Image printed for medical record.  Negative aspiration and no paresthesias; incremental administration of local anesthetic. The patient tolerated the procedure well. Vitals signes recorded in RN notes.  Popliteal and adductor canal nerve block for post op pain control.

## 2019-09-19 NOTE — H&P (Signed)
H and P has been reviewed and no changes are noted.  

## 2019-09-19 NOTE — Progress Notes (Signed)
Assisted Daniel Runkle ANMD with right, ultrasound guided, popliteal/saphenous block. Side rails up, monitors on throughout procedure. See vital signs in flow sheet. Tolerated Procedure well.  

## 2019-09-19 NOTE — Op Note (Signed)
Operative note   Surgeon: Dr. Albertine Patricia, DPM.    Assistant: None    Preop diagnosis: 1.  Achilles tendinitis right heel 2.  Calcaneal exostosis right foot 3.  Tight Achilles tendon right    Postop diagnosis: Same    Procedure:   1.  Secondary Achilles tendon repair right with 3.9 juggernaut anchor   2.  Resection of calcaneal exostosis right   3.  Gastrocnemius recession right     EBL: Less than 5 cc    Anesthesia:general delivered by the anesthesia team along with a popliteal block using Exparel    Hemostasis: Thigh tourniquet 3 mmHg pressure for 72 minutes    Specimen: None    Complications: None    Operative indications: Chronic pain posterior right heel unresponsive to conservative care    Procedure:  Patient was brought into the OR and placed on the operating table in theprone position. After anesthesia was obtained theright lower extremity was prepped and draped in usual sterile fashion.  Operative Report: Procedure #1.  At this time attention was directed to the posterior right lower leg.  A 3 cm linear incision was made just medial to the midline of the gastrocnemius tendon with sharp blunt dissection bleeders clamped bovied as required vital structures retracted medial laterally.  The tendon sheath was incised longitudinally and the peritenon was also incised longitudinally reflected medial laterally.  This point a 15 scalpel blade was utilized to release the gastrocnemius portion of the tendon from medial to lateral.  Once this was accomplished the area was checked and palpated and good release was achieved.  The tendon area was gapped approximately 2 cm with dorsiflexion of the foot.  Any septal areas were also released.  There was an copiously irrigated and the peritenon and tendon sheath were closed with 4-0 Vicryl continuous stitch as were deep superficial fascial layers.  Skin was closed with 4-0 Vicryl subcuticular stitch.  Procedure #2.  At this time the  posterior Achilles insertional region was identified and a 4 cm linear incision was made on the posterior aspect of the midline of the calcaneus and the posterior Achilles tendon.  This was deepened sharp blunt dissection bleeders clamped and bovied as required.  Incision made through the tendon sheath and peritenon this was reflected medial laterally.  Incision was carried midway through the tendon down to the dorsal aspect of the calcaneus posteriorly and also extended down into the inferior portion of the calcaneus in the posterior aspect.  This was then reflected away from a large posterior calcaneal spur.  Excessive scar tissue and fibrous tissue was noted in the tendon this was cleaned away with a 15 blade.  Once normal tendon was achieved this was then retracted medial laterally and the large exostosis was resected using a combination of a sagittal saw and a rasp.  The dorsal prominence of the calcaneus both dorsal medial and dorsal lateral and the posterior aspect was then resected rasped medially as well.  There is checked for FluoroScan excellent reduction of the bone proliferation was noted.  There is an copiously irrigated.  Procedure #3: At this time the tendon was inspected again any fibrotic or damaged portions of the tendon were removed.  A drill was used at this point and a 2.9 juggernaut anchor was placed into the posterior calcaneus.  This had 2 separate sutures emanating from the anchor.  This was seated well.  This point 2-0 Vicryl sutures were used to anchor the tendon back down  to the posterior calcaneus.  Once this was accomplished the residual tendon was sutured into the distal soft tissue and the midline incision on the proximal tendon incision was repaired with 2-0 Vicryl in a continuous stitch.  After copious irrigation of the peritenon and tendon sheath overlying the areas were closed with a continuous 3-0 Vicryl.  Deep superficial fascial layers then closed with 4-0 Vicryl in  continuous stitch.  Skin was closed with 4-0 Vicryl subcuticular fashion.  At this time a sterile compressive dressing was placed across wound consisting of Steri-Strips Xeroform gauze 4 x 4's conformer and Kerlix.  Tourniquet is released prior to complete vascularity seen return to digits of the right foot.    Patient tolerated the procedure and anesthesia well.  Was transported from the OR to the PACU with all vital signs stable and vascular status intact. To be discharged per routine protocol.  Will follow up in approximately 1 week in the outpatient clinic.

## 2019-09-19 NOTE — Anesthesia Procedure Notes (Signed)
Anesthesia Regional Block: Adductor canal block   Pre-Anesthetic Checklist: ,, timeout performed, Correct Patient, Correct Site, Correct Laterality, Correct Procedure, Correct Position, site marked, Risks and benefits discussed,  Surgical consent,  Pre-op evaluation,  At surgeon's request and post-op pain management  Laterality: Right  Prep: chloraprep       Needles:  Injection technique: Single-shot  Needle Type: Echogenic Needle     Needle Length: 9cm  Needle Gauge: 21     Additional Needles:   Procedures:,,,, ultrasound used (permanent image in chart),,,,  Narrative:  Start time: 09/19/2019 6:55 AM End time: 09/19/2019 7:10 AM Injection made incrementally with aspirations every 5 mL.  Performed by: Personally  Anesthesiologist: Elgie Collard, MD  Additional Notes: Functioning IV was confirmed and monitors applied. Ultrasound guidance: relevant anatomy identified, needle position confirmed, local anesthetic spread visualized around nerve(s)., vascular puncture avoided.  Image printed for medical record.  Negative aspiration and no paresthesias; incremental administration of local anesthetic. The patient tolerated the procedure well. Vitals signes recorded in RN notes.

## 2019-09-19 NOTE — Transfer of Care (Signed)
Immediate Anesthesia Transfer of Care Note  Patient: Onawa Inglis [id number]  Procedure(s) Performed: ACHILLES REPAIR SECONDARY RIGHT (Right Foot) HAGLUNDS/RETROCALCANEAL (OSTECTOMY) RIGHT (Right Foot) GASTROC RECESSION EXTREMITY RIGHT (Right Leg Lower)  Patient Location: PACU  Anesthesia Type: General ETT, Regional  Level of Consciousness: awake, alert  and patient cooperative  Airway and Oxygen Therapy: Patient Spontanous Breathing and Patient connected to supplemental oxygen  Post-op Assessment: Post-op Vital signs reviewed, Patient's Cardiovascular Status Stable, Respiratory Function Stable, Patent Airway and No signs of Nausea or vomiting  Post-op Vital Signs: Reviewed and stable  Complications: No apparent anesthesia complications

## 2019-09-19 NOTE — Discharge Instructions (Signed)
[place] DR. Musette Kisamore, DR. Vickki Muff, AND DR. Morral   1. Take your medication as prescribed.  Pain medication should be taken only as needed.  2. Keep the dressing clean, dry and intact.  3. Keep your foot elevated above the heart level for the first 48 hours.  4. Please stay completely nonweightbearing on the right foot  5.   Always use your appropriate devices if you are to be non-weight bearing.  6. Do not take a shower. Baths are permissible as long as the foot is kept out of the water.   7. Every hour you are awake:  - Bend your knee 15 times. - Flex foot 15 times - Massage calf 15 times  8. Call [facility] [phone]) if any of the following problems occur: - You develop a temperature or fever. - The bandage becomes saturated with blood. - Medication does not stop your pain. - Injury of the foot occurs. - Any symptoms of infection including redness, odor, or red streaks running from wound.  Information for Discharge Teaching: EXPAREL (bupivacaine liposome injectable suspension)   Your surgeon or anesthesiologist gave you EXPAREL(bupivacaine) to help control your pain after surgery.   EXPAREL is a local anesthetic that provides pain relief by numbing the tissue around the surgical site.  EXPAREL is designed to release pain medication over time and can control pain for up to 72 hours.  Depending on how you respond to EXPAREL, you may require less pain medication during your recovery.  Possible side effects:  Temporary loss of sensation or ability to move in the area where bupivacaine was injected.  Nausea, vomiting, constipation  Rarely, numbness and tingling in your mouth or lips, lightheadedness, or anxiety may occur.  Call your doctor right away if you think you may be experiencing any of these sensations, or if you have other questions  regarding possible side effects.  Follow all other discharge instructions given to you by your surgeon or nurse. Eat a healthy diet and drink plenty of water or other fluids.  If you return to the hospital for any reason within 96 hours following the administration of EXPAREL, it is important for health care providers to know that you have received this anesthetic. A teal colored band has been placed on your arm with the date, time and amount of EXPAREL you have received in order to alert and inform your health care providers. Please leave this armband in place for the full 96 hours following administration, and then you may remove the band.  General Anesthesia, Adult, Care After This sheet gives you information about how to care for yourself after your procedure. Your health care provider may also give you more specific instructions. If you have problems or questions, contact your health care provider. What can I expect after the procedure? After the procedure, the following side effects are common:  Pain or discomfort at the IV site.  Nausea.  Vomiting.  Sore throat.  Trouble concentrating.  Feeling cold or chills.  Weak or tired.  Sleepiness and fatigue.  Soreness and body aches. These side effects can affect parts of the body that were not involved in surgery. Follow these instructions at home:  For at least 24 hours after the procedure:  Have a responsible adult stay with you. It is important to have someone help care for you until you are awake and alert.  Rest as needed.  Do not: ? Participate in activities in which you could fall or become injured. ? Drive. ? Use heavy machinery. ? Drink alcohol. ? Take sleeping pills or medicines that cause drowsiness. ? Make important decisions or sign legal documents. ? Take care of children on your own. Eating and drinking  Follow any instructions from your health care provider about eating or drinking restrictions.  When  you feel hungry, start by eating small amounts of foods that are soft and easy to digest (bland), such as toast. Gradually return to your regular diet.  Drink enough fluid to keep your urine pale yellow.  If you vomit, rehydrate by drinking water, juice, or clear broth. General instructions  If you have sleep apnea, surgery and certain medicines can increase your risk for breathing problems. Follow instructions from your health care provider about wearing your sleep device: ? Anytime you are sleeping, including during daytime naps. ? While taking prescription pain medicines, sleeping medicines, or medicines that make you drowsy.  Return to your normal activities as told by your health care provider. Ask your health care provider what activities are safe for you.  Take over-the-counter and prescription medicines only as told by your health care provider.  If you smoke, do not smoke without supervision.  Keep all follow-up visits as told by your health care provider. This is important. Contact a health care provider if:  You have nausea or vomiting that does not get better with medicine.  You cannot eat or drink without vomiting.  You have pain that does not get better with medicine.  You are unable to pass urine.  You develop a skin rash.  You have a fever.  You have redness around your IV site that gets worse. Get help right away if:  You have difficulty breathing.  You have chest pain.  You have blood in your urine or stool, or you vomit blood. Summary  After the procedure, it is common to have a sore throat or nausea. It is also common to feel tired.  Have a responsible adult stay with you for the first 24 hours after general anesthesia. It is important to have someone help care for you until you are awake and alert.  When you feel hungry, start by eating small amounts of foods that are soft and easy to digest (bland), such as toast. Gradually return to your regular  diet.  Drink enough fluid to keep your urine pale yellow.  Return to your normal activities as told by your health care provider. Ask your health care provider what activities are safe for you. This information is not intended to replace advice given to you by your health care provider. Make sure you discuss any questions you have with your health care provider. Document Revised: 04/14/2017 Document Reviewed: 11/25/2016 Elsevier Patient Education  [place].

## 2019-09-20 ENCOUNTER — Encounter: Payer: Self-pay | Admitting: *Deleted

## 2019-09-24 ENCOUNTER — Other Ambulatory Visit: Payer: Self-pay | Admitting: Obstetrics and Gynecology

## 2019-09-24 DIAGNOSIS — Z1231 Encounter for screening mammogram for malignant neoplasm of breast: Secondary | ICD-10-CM

## 2019-11-01 ENCOUNTER — Other Ambulatory Visit
Admission: RE | Admit: 2019-11-01 | Discharge: 2019-11-01 | Disposition: A | Discharge status: Home / Self Care | Payer: BC Managed Care – PPO | Source: Ambulatory Visit | Attending: Gastroenterology | Admitting: Gastroenterology

## 2019-11-01 DIAGNOSIS — R197 Diarrhea, unspecified: Secondary | ICD-10-CM | POA: Diagnosis not present

## 2019-11-01 DIAGNOSIS — Z8619 Personal history of other infectious and parasitic diseases: Secondary | ICD-10-CM | POA: Diagnosis not present

## 2019-11-01 DIAGNOSIS — R109 Unspecified abdominal pain: Secondary | ICD-10-CM | POA: Insufficient documentation

## 2019-11-01 LAB — GASTROINTESTINAL PANEL BY PCR, STOOL (REPLACES STOOL CULTURE)

## 2019-11-05 ENCOUNTER — Other Ambulatory Visit
Admission: RE | Admit: 2019-11-05 | Discharge: 2019-11-05 | Disposition: A | Discharge status: Home / Self Care | Payer: BC Managed Care – PPO | Source: Ambulatory Visit | Attending: Student | Admitting: Student

## 2019-11-05 DIAGNOSIS — A09 Infectious gastroenteritis and colitis, unspecified: Secondary | ICD-10-CM | POA: Insufficient documentation

## 2019-11-05 DIAGNOSIS — R197 Diarrhea, unspecified: Secondary | ICD-10-CM | POA: Diagnosis not present

## 2019-11-05 DIAGNOSIS — Z8619 Personal history of other infectious and parasitic diseases: Secondary | ICD-10-CM | POA: Insufficient documentation

## 2019-11-05 LAB — C DIFFICILE QUICK SCREEN W PCR REFLEX
C Diff antigen: NEGATIVE
C Diff interpretation: NOT DETECTED
C Diff toxin: NEGATIVE

## 2019-11-05 LAB — GASTROINTESTINAL PANEL BY PCR, STOOL (REPLACES STOOL CULTURE)

## 2019-12-29 ENCOUNTER — Encounter: Payer: Self-pay | Admitting: Emergency Medicine

## 2019-12-29 ENCOUNTER — Ambulatory Visit
Admission: EM | Admit: 2019-12-29 | Discharge: 2019-12-29 | Disposition: A | Discharge status: Home / Self Care | Payer: BC Managed Care – PPO

## 2019-12-29 ENCOUNTER — Other Ambulatory Visit: Payer: Self-pay

## 2019-12-29 DIAGNOSIS — R002 Palpitations: Secondary | ICD-10-CM

## 2019-12-29 DIAGNOSIS — F419 Anxiety disorder, unspecified: Secondary | ICD-10-CM

## 2019-12-29 NOTE — ED Triage Notes (Signed)
Patient states that it feels like her heart is fluttering.  Patient denies chest pain or SOB.  Patient states that she has anxiety.

## 2019-12-29 NOTE — Discharge Instructions (Addendum)
EKG is normal today and your exam is also normal.  I do believe the heart fluttering and palpitations probably is due to anxiety.  Take Xanax when he got home and try to do something relaxing.  Continue to follow-up with your PCP about your anxiety.  Call your cardiologist for another appointment to try wearing the Holter monitor again to see if there is an arrhythmia that is coming and going.  Go to the emergency room if you have any chest pain, dizziness, weakness, vomiting, sweats or breathing difficulty.

## 2019-12-29 NOTE — ED Provider Notes (Signed)
[facility]    CSN: [id number] Arrival date & time: 12/29/19  1541      History   Chief Complaint Chief Complaint  Patient presents with   Palpitations   Anxiety    HPI Nancy Fowler is a 64 y.o. female.   64 year old female presents for a sensation of fluttering of the left side of her chest over the past couple of days.  She gets this feeling a couple times a day and says it was worse today.  She admits that she has a history of anxiety and recently started taking Prozac 3 weeks ago.  She has Xanax in case she needs it, but wanted to get checked out to see if there was any other reason for the way she was feeling.  Patient denies fever, fatigue, chest pain, shortness of breath, dizziness, weakness, nausea, vomiting, vision changes, numbness or tingling.  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  She said it is hard to tell if she is having that sensation currently or not.  She does admit to many new stressors in her life recently.  No other concerns today.     Past Medical History:  Diagnosis Date   Acute pancreatitis 3 years ago   Anemia    Cancer ([place])    skin basal ca   Diabetes mellitus without complication (HCC)    Type 2   Dysrhythmia    occasional PVC, Dr Nehemiah Massed cardiologist   GERD (gastroesophageal reflux disease)    Hypercholesteremia    Hypertension    Motion sickness     There are no problems to display for this patient.   Past Surgical History:  Procedure Laterality Date   ACHILLES TENDON REPAIR Left    ACHILLES TENDON SURGERY Right 09/19/2019   Procedure: ACHILLES REPAIR SECONDARY RIGHT;  Surgeon: Albertine Patricia, DPM;  Location: [place];  Service: Podiatry;  Laterality: Right;  GENERAL WITH POP BLOCK   BREAST BIOPSY Left    neg   CARPAL TUNNEL RELEASE     CATARACT EXTRACTION, BILATERAL  10/2018 and 11/2018   eye corrective  64 years old   GASTROC RECESSION  EXTREMITY Right 09/19/2019   Procedure: GASTROC RECESSION EXTREMITY RIGHT;  Surgeon: Albertine Patricia, DPM;  Location: [place];  Service: Podiatry;  Laterality: Right;   OSTECTOMY Right 09/19/2019   Procedure: HAGLUNDS/RETROCALCANEAL (OSTECTOMY) RIGHT;  Surgeon: Albertine Patricia, DPM;  Location: [place];  Service: Podiatry;  Laterality: Right;   TONSILLECTOMY      OB History   No obstetric history on file.      Home Medications    Prior to Admission medications   Medication Sig Start Date End Date Taking? Authorizing Provider  ALPRAZolam Duanne Moron) 0.25 MG tablet Take 0.25 mg by mouth at bedtime as needed for anxiety.   Yes [provider]  aspirin 81 MG EC tablet Take 1 tablet (81 mg total) by mouth daily. Swallow whole. 05/22/15  Yes Earleen Newport, MD  atorvastatin (LIPITOR) 20 MG tablet Take 20 mg by mouth daily.   Yes [provider]  Cholecalciferol 25 MCG (1000 UT) tablet Take 1,000 Units by mouth daily.   Yes [provider]  Coenzyme Q10 (COQ10) 200 MG CAPS Take by mouth.   Yes [provider]  cyanocobalamin 1000 MCG tablet Take 1,000 mcg by mouth daily.   Yes [provider]  FLUoxetine (PROZAC) 10 MG capsule fluoxetine  10 mg capsule 12/06/19  Yes [provider]  glimepiride (AMARYL) 1 MG tablet Take 1 mg by mouth daily with breakfast.   Yes [provider]  losartan-hydrochlorothiazide (HYZAAR) 100-25 MG tablet Take 1 tablet by mouth daily.   Yes [provider]  Magnesium 300 MG CAPS Take by mouth. Heart Calm pill   Yes [provider]  OMEGA-3 FATTY ACIDS PO Take by mouth.   Yes [provider]  omeprazole (PRILOSEC) 20 MG capsule Take 20 mg by mouth daily.   Yes [provider]  Probiotic Product (PROBIOTIC ADVANCED PO) Take by mouth.   Yes [provider]  glimepiride (AMARYL) 2 MG tablet Take 2 mg by mouth daily with breakfast.     [provider]  oxyCODONE-acetaminophen (PERCOCET) 7.5-325 MG tablet Take 1 tablet by mouth every 4 (four) hours as needed for severe pain. 09/19/19   Albertine Patricia, DPM    Family History Family History  Problem Relation Age of Onset   Heart attack Mother    Parkinson's disease Mother    Hypertension Mother    Glaucoma Mother    Skin cancer Mother    Cancer Father    Skin cancer Father    Breast cancer Paternal Grandmother     Social History Social History   Tobacco Use   Smoking status: Never Smoker   Smokeless tobacco: Never Used  Substance Use Topics   Alcohol use: No   Drug use: Never     Allergies   Patient has no known allergies.   Review of Systems Review of Systems  Constitutional: Negative for chills, diaphoresis, fatigue and fever.  HENT: Negative for congestion, rhinorrhea, sinus pressure, sinus pain and sore throat.   Respiratory: Negative for cough and shortness of breath.   Cardiovascular: Positive for palpitations. Negative for chest pain.  Gastrointestinal: Negative for abdominal pain, nausea and vomiting.  Musculoskeletal: Negative for arthralgias and myalgias.  Skin: Negative for rash.  Neurological: Negative for dizziness, weakness, light-headedness, numbness and headaches.  Hematological: Negative for adenopathy.  Psychiatric/Behavioral: The patient is nervous/anxious.      Physical Exam Triage Vital Signs ED Triage Vitals  Enc Vitals Group     BP 12/29/19 1600 131/63     Pulse Rate 12/29/19 1600 79     Resp 12/29/19 1600 16     Temp 12/29/19 1600 98.2 F (36.8 C)     Temp Source 12/29/19 1600 Oral     SpO2 12/29/19 1600 99 %     Weight 12/29/19 1558 175 lb (79.4 kg)     Height 12/29/19 1558 5\' 4"  (1.626 m)     Head Circumference --      Peak Flow --      Pain Score 12/29/19 1558 0     Pain Loc --      Pain Edu? --      Excl. in [place]? --    No data found.  Updated Vital Signs BP 131/63 (BP Location: Left  Arm)    Pulse 79    Temp 98.2 F (36.8 C) (Oral)    Resp 16    Ht 5\' 4"  (1.626 m)    Wt 175 lb (79.4 kg)    SpO2 99%    BMI 30.04 kg/m   :     Physical Exam Vitals and nursing note reviewed.  Constitutional:      General: She is not in acute distress.    Appearance: Normal appearance. She is not  ill-appearing or toxic-appearing.  HENT:     Head: Normocephalic and atraumatic.  Eyes:     General: No scleral icterus.       Right eye: No discharge.        Left eye: No discharge.     Conjunctiva/sclera: Conjunctivae normal.  Cardiovascular:     Rate and Rhythm: Normal rate and regular rhythm.     Pulses: Normal pulses.     Heart sounds: Normal heart sounds.  Pulmonary:     Effort: Pulmonary effort is normal. No respiratory distress.     Breath sounds: Normal breath sounds. No wheezing, rhonchi or rales.  Musculoskeletal:     Cervical back: Neck supple.  Skin:    General: Skin is dry.  Neurological:     General: No focal deficit present.     Mental Status: She is alert. Mental status is at baseline.     Motor: No weakness.     Gait: Gait normal.  Psychiatric:        Mood and Affect: Mood normal.        Behavior: Behavior normal.        Thought Content: Thought content normal.      UC Treatments / Results  Labs (all labs ordered are listed, but only abnormal results are displayed) Labs Reviewed - No data to display  EKG   Radiology No results found.  Procedures Procedures (including critical care time)  Medications Ordered in UC Medications - No data to display  Initial Impression / Assessment and Plan / UC Course  I have reviewed the triage vital signs and the nursing notes.  Pertinent labs & imaging results that were available during my care of the patient were reviewed by me and considered in my medical decision making (see chart for details).   64 year old female with known anxiety presenting for palpitations and possible heart fluttering today.  Besides  anxiety she also has a known history of PVCs.  She denies any other associated symptoms.  On exam, she is well-appearing.  All vital signs are stable and her chest is clear to auscultation.  Heart regular rate and rhythm.  EKG is normal today: Normal sinus rhythm and rate.  Advised patient to take her Xanax when she gets home and continue Prozac.  Follow-up with PCP about anxiety management and try to do something relaxing at home.  Advised her to contact her cardiologist for a follow-up appointment and possibly to wear a Holter monitor again.  Advised her to go to the emergency room if she has any associated chest pain, shortness of breath, dizziness, weakness or any new or worsening symptoms.  Final Clinical Impressions(s) / UC Diagnoses   Final diagnoses:  Anxiety  Palpitations     Discharge Instructions     EKG is normal today and your exam is also normal.  I do believe the heart fluttering and palpitations probably is due to anxiety.  Take Xanax when he got home and try to do something relaxing.  Continue to follow-up with your PCP about your anxiety.  Call your cardiologist for another appointment to try wearing the Holter monitor again to see if there is an arrhythmia that is coming and going.  Go to the emergency room if you have any chest pain, dizziness, weakness, vomiting, sweats or breathing difficulty.    ED Prescriptions    None     PDMP not reviewed this encounter.   Danton Clap, PA-C 12/29/19 [phone]

## 2020-02-18 ENCOUNTER — Other Ambulatory Visit: Payer: Self-pay

## 2020-02-18 DIAGNOSIS — R102 Pelvic and perineal pain: Secondary | ICD-10-CM

## 2020-02-20 ENCOUNTER — Ambulatory Visit: Payer: BC Managed Care – PPO

## 2020-02-20 NOTE — Progress Notes (Signed)
02/21/2020 3:56 PM   Nancy Fowler [birth year] [id number]  Referring provider: Sallee Lange, NP [address] [place],  [place] [address] Chief Complaint  Patient presents with  . New Patient (Initial Visit)    pelvic pain    HPI: Nancy Fowler is a 64 y.o. female who presents today for evaluation and management of pelvic and perineal pain.   The patient reportedly saw her GYN in 12/2019, pelvic organs and bladder were normal.   UA from 12/06/2019 showed rare bacteria otherwise negative.   The patient has a wellness exam with Gaetano Net, NP on 01/28/2020. Her DM-2, hypertension, hyperlipidemia and GERD were all under good control. She is taking trazodone to help her sleep better. She continued to have pelvic pain. Pain is aching/burning. She as using estradiol cream twice per week and Azo without improvement. She was being seen by PT for pelvic floor exercises and obturator muscle. Patient denied seeing changes in symptoms with PT. Different treatment options were discussed and patient opted to see urology.   She reports that she had right achilles tendon repair on 09/19/2019. Tendon is healing properly. After 5 weeks she was back walking with a boot and noticed a burning sensation in her vaginal area. She feels like sitting x 5 weeks is the cause her of burning and general vaginal pain/discomfort. Pain is external and occurs while sitting, walking or laying down. Pain is persistent but has slightly improved over time. She has used a donut for relieve pressure. The patient feels like this could be nerve related.   She is followed by GYN at [facility]. She went to PT for pelvic floor exercise and obturator muscle without symptomatic relief.  No infection. Denies bladder symptoms. Denies usage of vaginal valium cream. When walking she has increase urinary urgency.   She is postmenopausal.    PMH: Past Medical History:  Diagnosis Date  . Acute pancreatitis 3 years ago  .  Anemia   . Cancer ([place])    skin basal ca  . Diabetes mellitus without complication (HCC)    Type 2  . Dysrhythmia    occasional PVC, Dr Nehemiah Massed cardiologist  . GERD (gastroesophageal reflux disease)   . Hypercholesteremia   . Hypertension   . Motion sickness     Surgical History: Past Surgical History:  Procedure Laterality Date  . ACHILLES TENDON REPAIR Left   . ACHILLES TENDON SURGERY Right 09/19/2019   Procedure: ACHILLES REPAIR SECONDARY RIGHT;  Surgeon: Albertine Patricia, DPM;  Location: [place];  Service: Podiatry;  Laterality: Right;  GENERAL WITH POP BLOCK  . BREAST BIOPSY Left    neg  . CARPAL TUNNEL RELEASE    . CATARACT EXTRACTION, BILATERAL  10/2018 and 11/2018  . eye corrective  64 years old  . GASTROC RECESSION EXTREMITY Right 09/19/2019   Procedure: GASTROC RECESSION EXTREMITY RIGHT;  Surgeon: Albertine Patricia, DPM;  Location: [place];  Service: Podiatry;  Laterality: Right;  . OSTECTOMY Right 09/19/2019   Procedure: HAGLUNDS/RETROCALCANEAL (OSTECTOMY) RIGHT;  Surgeon: Albertine Patricia, DPM;  Location: [place];  Service: Podiatry;  Laterality: Right;  . TONSILLECTOMY      Home Medications:  Allergies as of 02/21/2020   No Known Allergies     Medication List       Accurate as of February 21, 2020  3:56 PM. If you have any questions, ask your nurse or doctor.        STOP taking these medications  FLUoxetine 10 MG capsule Commonly known as: PROZAC Stopped by: Hollice Espy, MD   oxyCODONE-acetaminophen 7.5-325 MG tablet Commonly known as: Percocet Stopped by: Hollice Espy, MD     TAKE these medications   ALPRAZolam 0.25 MG tablet Commonly known as: XANAX Take 0.25 mg by mouth at bedtime as needed for anxiety.   aspirin 81 MG EC tablet Take 1 tablet (81 mg total) by mouth daily. Swallow whole.   atorvastatin 20 MG tablet Commonly known as: LIPITOR Take 20 mg by mouth daily.   Cholecalciferol 25 MCG (1000 UT)  tablet Take 1,000 Units by mouth daily.   CoQ10 200 MG Caps Take by mouth.   cyanocobalamin 1000 MCG tablet Take 1,000 mcg by mouth daily.   estradiol 0.1 MG/GM vaginal cream Commonly known as: ESTRACE Place 1 g vaginally 2 (two) times a week.   glimepiride 2 MG tablet Commonly known as: AMARYL Take 2 mg by mouth daily with breakfast. What changed: Another medication with the same name was removed. Continue taking this medication, and follow the directions you see here. Changed by: Hollice Espy, MD   losartan-hydrochlorothiazide 100-25 MG tablet Commonly known as: HYZAAR Take 1 tablet by mouth daily.   Magnesium 300 MG Caps Take by mouth. Heart Calm pill   OMEGA-3 FATTY ACIDS PO Take by mouth.   omeprazole 20 MG capsule Commonly known as: PRILOSEC Take 20 mg by mouth daily.   PROBIOTIC ADVANCED PO Take by mouth.       Allergies: No Known Allergies  Family History: Family History  Problem Relation Age of Onset  . Heart attack Mother   . Parkinson's disease Mother   . Hypertension Mother   . Glaucoma Mother   . Skin cancer Mother   . Cancer Father   . Skin cancer Father   . Breast cancer Paternal Grandmother   . Prostate cancer Neg Hx   . Bladder Cancer Neg Hx   . Kidney cancer Neg Hx     Social History:  reports that she has never smoked. She has never used smokeless tobacco. She reports that she does not drink alcohol and does not use drugs.   Physical Exam: BP 115/67   Pulse 75   Ht 5\' 4"  (1.626 m)   Wt 170 lb (77.1 kg)   BMI 29.18 kg/m   Constitutional:  Alert and oriented, No acute distress. HEENT: [place] AT, moist mucus membranes.  Trachea midline, no masses. Cardiovascular: No clubbing, cyanosis, or edema. Respiratory: Normal respiratory effort, no increased work of breathing. Skin: No rashes, bruises or suspicious lesions. Neurologic: Grossly intact, no focal deficits, moving all 4 extremities. Psychiatric: Normal mood and  affect.  Laboratory Data:  Urinalysis Negative  Assessment & Plan:    1. Pelvic/perineal pain  UA is clear today which is reassuring Likely neuropathic in nature based on symptoms This is outside of my usual scope of usual practice-no urological symptoms Will refer patient to [facility] pelvic pain clinic Encourage patient to continue physical therapy -Trial vaginal Valium Rx sent to pharmacy. Precautions were reviewed.  -Ambulatory referral to [facility] [address] [place], [place] [address] [phone]  I, Selena Batten, am acting as a scribe for Dr. Hollice Espy.  I have reviewed the above documentation for accuracy and completeness, and I agree with the above.   Hollice Espy, MD

## 2020-02-21 ENCOUNTER — Other Ambulatory Visit: Payer: Self-pay

## 2020-02-21 ENCOUNTER — Ambulatory Visit
Admission: RE | Admit: 2020-02-21 | Discharge: 2020-02-21 | Disposition: A | Discharge status: Home / Self Care | Payer: BC Managed Care – PPO | Attending: Urology | Admitting: Urology

## 2020-02-21 ENCOUNTER — Other Ambulatory Visit
Admission: RE | Admit: 2020-02-21 | Discharge: 2020-02-21 | Disposition: A | Discharge status: Home / Self Care | Payer: BC Managed Care – PPO | Source: Ambulatory Visit | Attending: Urology | Admitting: Urology

## 2020-02-21 ENCOUNTER — Ambulatory Visit (INDEPENDENT_AMBULATORY_CARE_PROVIDER_SITE_OTHER): Payer: BC Managed Care – PPO | Admitting: Urology

## 2020-02-21 ENCOUNTER — Encounter: Payer: Self-pay | Admitting: Urology

## 2020-02-21 VITALS — BP 115/67 | HR 75 | Ht 64.0 in | Wt 170.0 lb

## 2020-02-21 DIAGNOSIS — R102 Pelvic and perineal pain: Secondary | ICD-10-CM | POA: Diagnosis not present

## 2020-02-21 LAB — URINALYSIS, COMPLETE (UACMP) WITH MICROSCOPIC
Bacteria, UA: NONE SEEN
Bilirubin Urine: NEGATIVE
Glucose, UA: NEGATIVE mg/dL
Ketones, ur: NEGATIVE mg/dL
Leukocytes,Ua: NEGATIVE
Nitrite: NEGATIVE
Protein, ur: NEGATIVE mg/dL
Specific Gravity, Urine: 1.015 (ref 1.005–1.030)
Squamous Epithelial / HPF: NONE SEEN (ref 0–5)
WBC, UA: NONE SEEN WBC/hpf (ref 0–5)
pH: 6.5 (ref 5.0–8.0)

## 2020-02-21 MED ORDER — DIAZEPAM 10 MG PO TABS
10.0000 mg | ORAL_TABLET | Freq: Once | ORAL | 0 refills | Status: AC
Start: 2020-02-21 — End: 2020-02-21

## 2020-03-10 ENCOUNTER — Other Ambulatory Visit: Payer: Self-pay

## 2020-03-10 ENCOUNTER — Ambulatory Visit
Admission: RE | Admit: 2020-03-10 | Discharge: 2020-03-10 | Disposition: A | Discharge status: Home / Self Care | Payer: BC Managed Care – PPO | Source: Ambulatory Visit | Attending: Obstetrics and Gynecology | Admitting: Obstetrics and Gynecology

## 2020-03-10 DIAGNOSIS — Z1231 Encounter for screening mammogram for malignant neoplasm of breast: Secondary | ICD-10-CM | POA: Insufficient documentation

## 2021-03-01 ENCOUNTER — Other Ambulatory Visit: Payer: Self-pay | Admitting: Obstetrics and Gynecology

## 2021-03-01 DIAGNOSIS — Z1231 Encounter for screening mammogram for malignant neoplasm of breast: Secondary | ICD-10-CM

## 2021-05-31 ENCOUNTER — Ambulatory Visit
Admission: RE | Admit: 2021-05-31 | Discharge: 2021-05-31 | Disposition: A | Discharge status: Home / Self Care | Payer: BC Managed Care – PPO | Source: Ambulatory Visit | Attending: Obstetrics and Gynecology | Admitting: Obstetrics and Gynecology

## 2021-05-31 ENCOUNTER — Other Ambulatory Visit: Payer: Self-pay

## 2021-05-31 DIAGNOSIS — Z1231 Encounter for screening mammogram for malignant neoplasm of breast: Secondary | ICD-10-CM | POA: Diagnosis present

## 2022-05-27 ENCOUNTER — Other Ambulatory Visit: Payer: Self-pay | Admitting: Obstetrics and Gynecology

## 2022-05-27 DIAGNOSIS — Z1231 Encounter for screening mammogram for malignant neoplasm of breast: Secondary | ICD-10-CM

## 2022-06-07 ENCOUNTER — Ambulatory Visit: Payer: BC Managed Care – PPO

## 2022-06-09 ENCOUNTER — Ambulatory Visit
Admission: RE | Admit: 2022-06-09 | Discharge: 2022-06-09 | Disposition: A | Discharge status: Home / Self Care | Payer: BC Managed Care – PPO | Source: Ambulatory Visit | Attending: Obstetrics and Gynecology | Admitting: Obstetrics and Gynecology

## 2022-06-09 DIAGNOSIS — Z1231 Encounter for screening mammogram for malignant neoplasm of breast: Secondary | ICD-10-CM | POA: Insufficient documentation

## 2022-08-06 IMAGING — MG MM DIGITAL SCREENING BILAT W/ TOMO AND CAD
8 of 14 series · 8 of 40 positions shown · non-contrast
Comparison: Previous exam(s).

CLINICAL DATA: Screening.

EXAM:
DIGITAL SCREENING BILATERAL MAMMOGRAM WITH TOMOSYNTHESIS AND CAD
TECHNIQUE: Bilateral screening digital craniocaudal and mediolateral oblique
mammograms were obtained. Bilateral screening digital breast
tomosynthesis was performed. The images were evaluated with
computer-aided detection.

[R CC synth-2D]
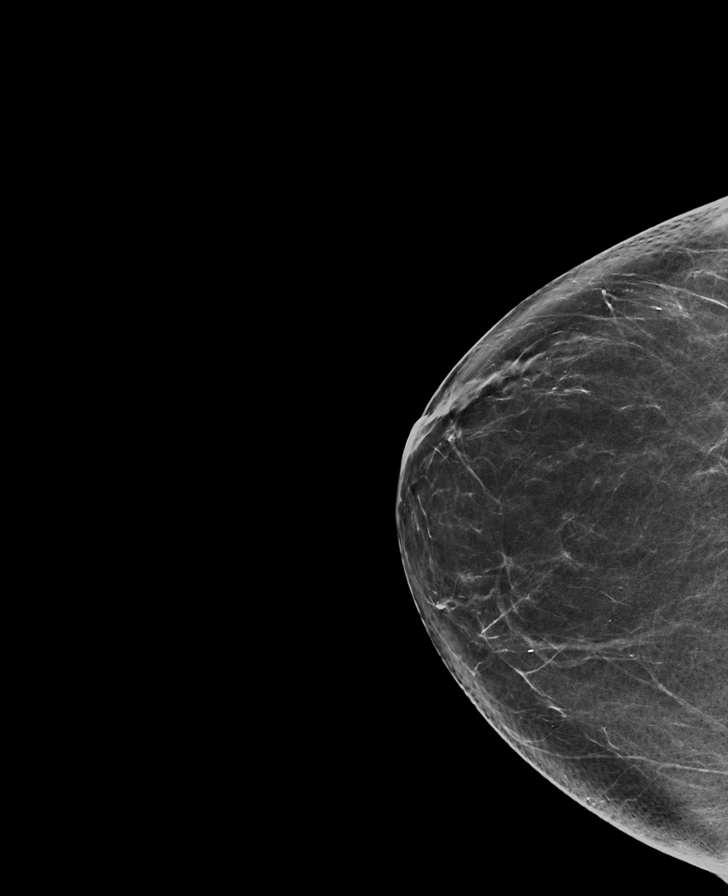

[L MLO synth-2D (1 of 3)]
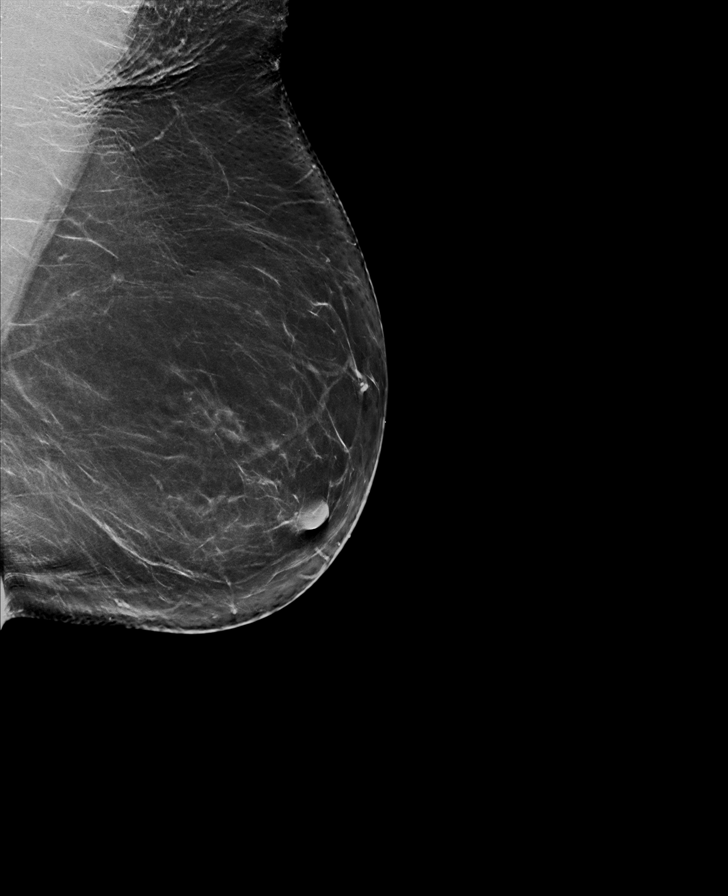

[R MLO synth-2D (1 of 2)]
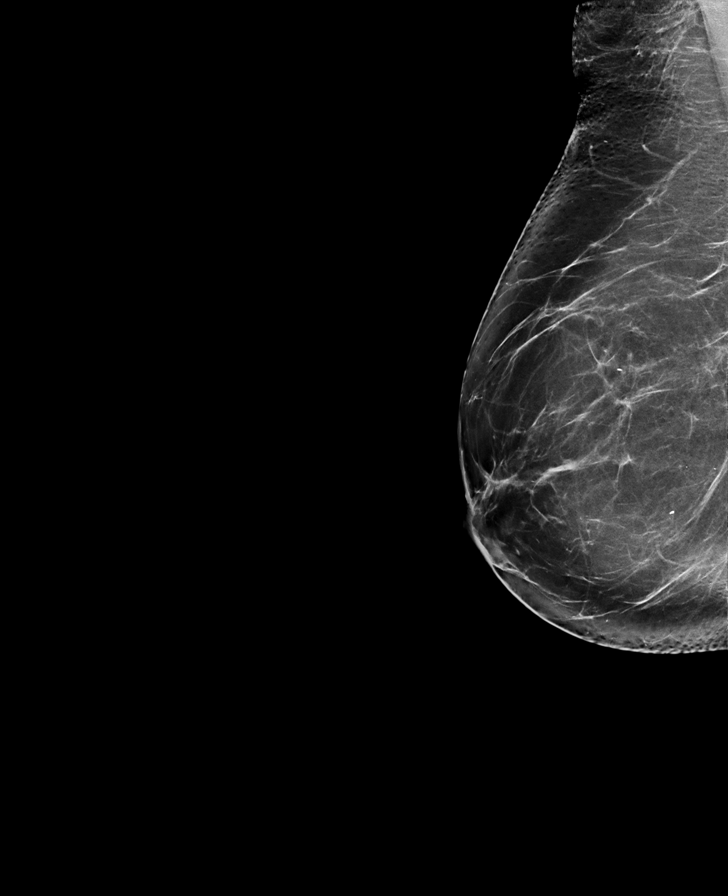

[L MLO synth-2D (2 of 3)]
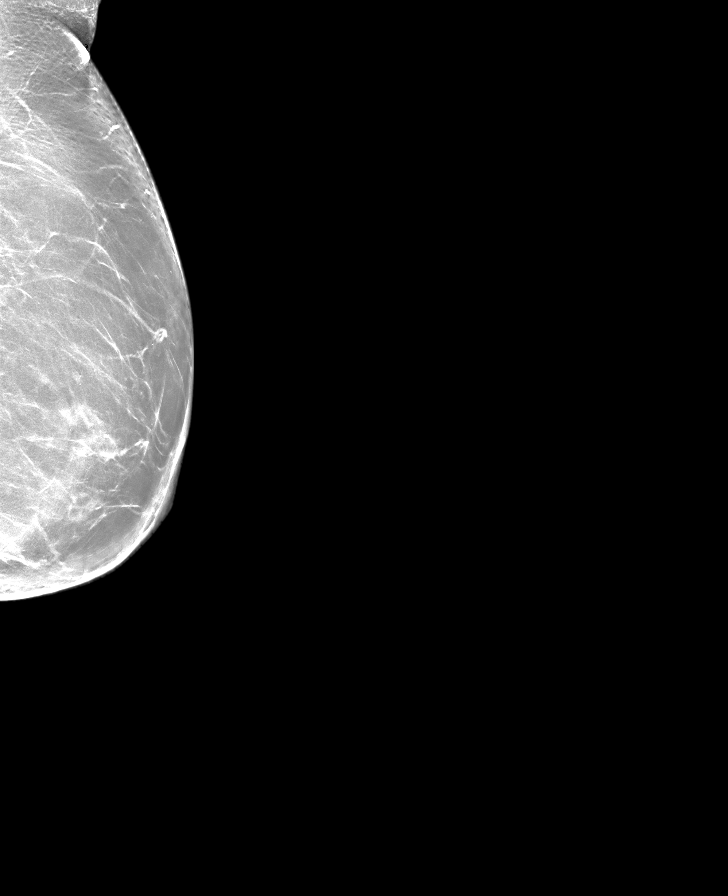

[L CC synth-2D]
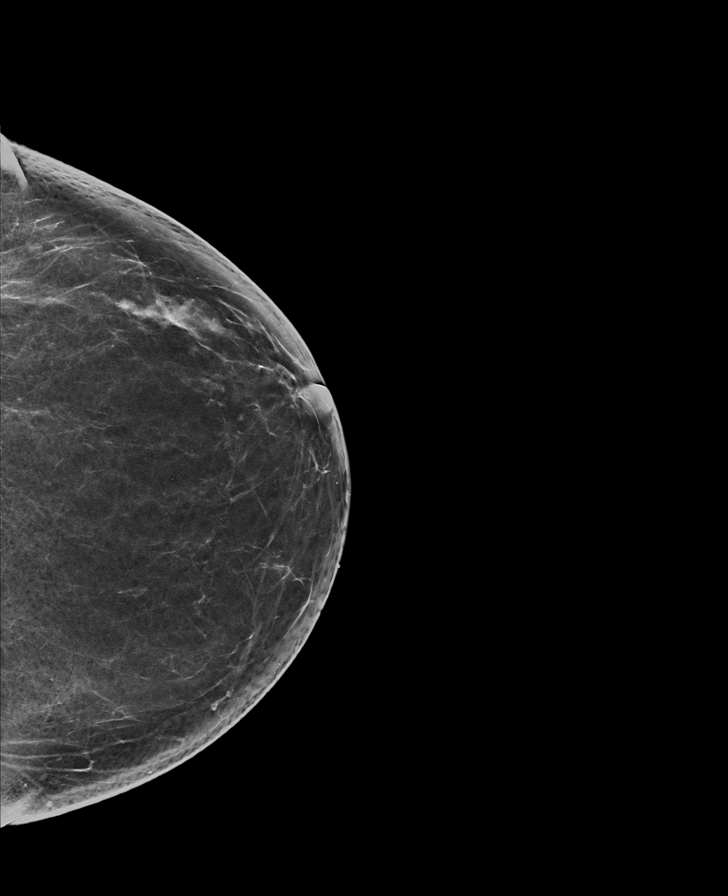

[R MLO synth-2D (2 of 2)]
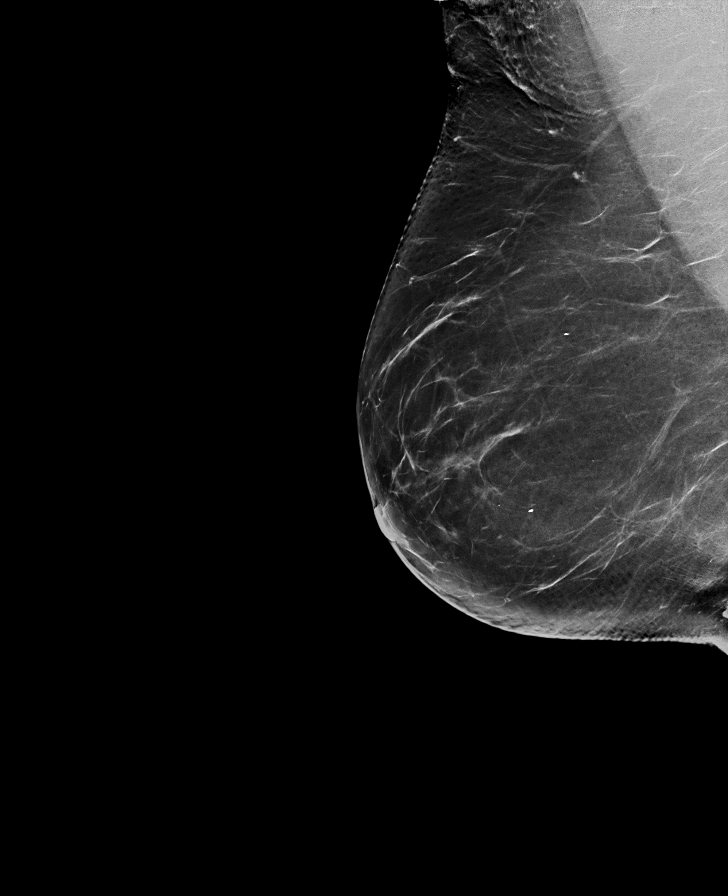

[L MLO synth-2D (3 of 3)]
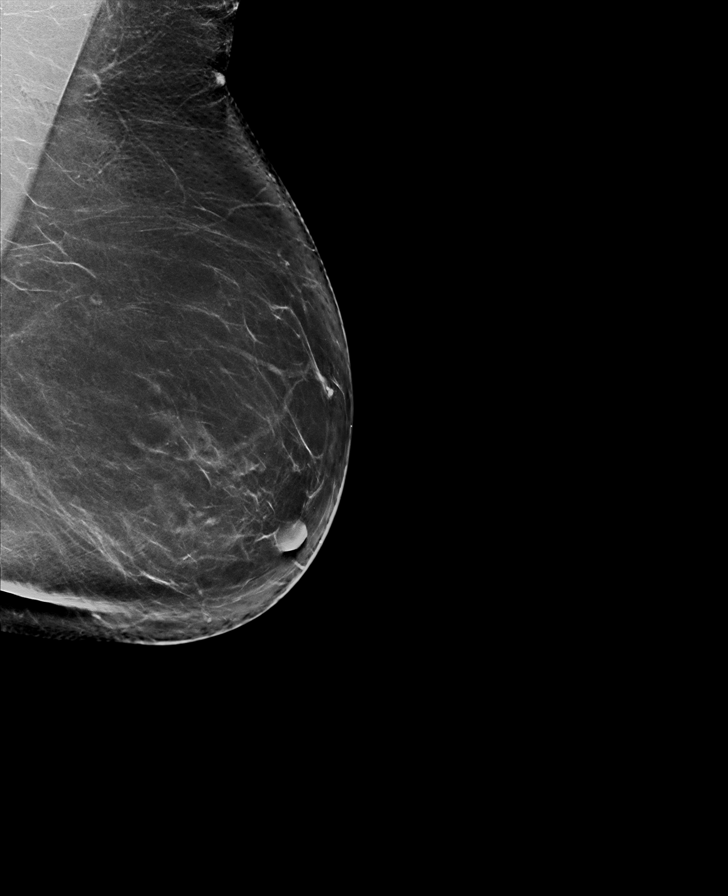

[L MLO tomo · tomo slice 41/82.0]
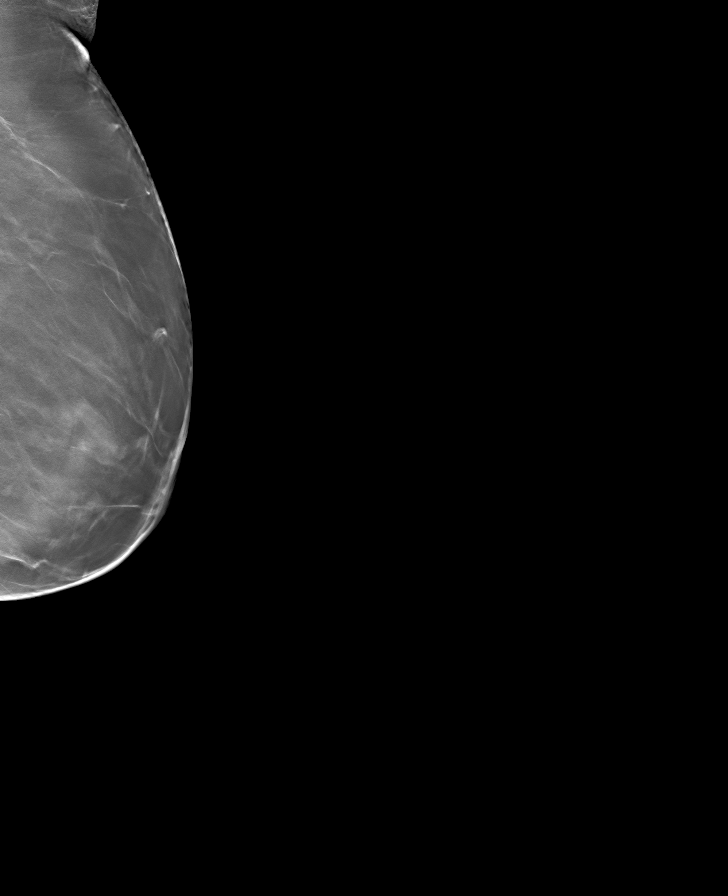

[8 of 40 positions shown; findings below may reference images not displayed]

ACR Breast Density Category b: There are scattered areas of
fibroglandular density.
FINDINGS: There are no findings suspicious for malignancy.
IMPRESSION: No mammographic evidence of malignancy. A result letter of this
screening mammogram will be mailed directly to the patient.

RECOMMENDATION:
Screening mammogram in one year. (Code:51-O-LD2)

BI-RADS CATEGORY  1: Negative.

## 2022-10-12 ENCOUNTER — Ambulatory Visit: Payer: BC Managed Care – PPO

## 2022-10-12 DIAGNOSIS — Z1211 Encounter for screening for malignant neoplasm of colon: Secondary | ICD-10-CM | POA: Diagnosis present

## 2022-10-12 DIAGNOSIS — K5730 Diverticulosis of large intestine without perforation or abscess without bleeding: Secondary | ICD-10-CM | POA: Diagnosis not present

## 2022-10-12 DIAGNOSIS — K648 Other hemorrhoids: Secondary | ICD-10-CM | POA: Diagnosis not present

## 2023-04-03 ENCOUNTER — Other Ambulatory Visit: Payer: Self-pay | Admitting: Nurse Practitioner

## 2023-04-03 DIAGNOSIS — Z1231 Encounter for screening mammogram for malignant neoplasm of breast: Secondary | ICD-10-CM

## 2023-06-14 ENCOUNTER — Inpatient Hospital Stay: Admission: RE | Admit: 2023-06-14 | Payer: Self-pay | Source: Ambulatory Visit

## 2023-06-19 ENCOUNTER — Ambulatory Visit
Admission: RE | Admit: 2023-06-19 | Discharge: 2023-06-19 | Disposition: A | Discharge status: Home / Self Care | Payer: Medicare Other | Source: Ambulatory Visit | Attending: Nurse Practitioner | Admitting: Nurse Practitioner

## 2023-06-19 DIAGNOSIS — Z1231 Encounter for screening mammogram for malignant neoplasm of breast: Secondary | ICD-10-CM | POA: Diagnosis present

## 2024-02-14 ENCOUNTER — Other Ambulatory Visit: Payer: Self-pay | Admitting: Physician Assistant

## 2024-02-14 DIAGNOSIS — M79604 Pain in right leg: Secondary | ICD-10-CM

## 2024-02-14 DIAGNOSIS — R29898 Other symptoms and signs involving the musculoskeletal system: Secondary | ICD-10-CM

## 2024-02-14 DIAGNOSIS — R200 Anesthesia of skin: Secondary | ICD-10-CM

## 2024-02-14 DIAGNOSIS — G8929 Other chronic pain: Secondary | ICD-10-CM

## 2024-02-16 ENCOUNTER — Ambulatory Visit
Admission: RE | Admit: 2024-02-16 | Discharge: 2024-02-16 | Disposition: A | Discharge status: Home / Self Care | Source: Ambulatory Visit | Attending: Physician Assistant | Admitting: Physician Assistant

## 2024-02-16 DIAGNOSIS — M5441 Lumbago with sciatica, right side: Secondary | ICD-10-CM | POA: Diagnosis present

## 2024-02-16 DIAGNOSIS — R202 Paresthesia of skin: Secondary | ICD-10-CM | POA: Diagnosis present

## 2024-02-16 DIAGNOSIS — G8929 Other chronic pain: Secondary | ICD-10-CM | POA: Insufficient documentation

## 2024-02-16 DIAGNOSIS — M79604 Pain in right leg: Secondary | ICD-10-CM | POA: Diagnosis present

## 2024-02-16 DIAGNOSIS — R200 Anesthesia of skin: Secondary | ICD-10-CM | POA: Insufficient documentation

## 2024-02-16 DIAGNOSIS — R29898 Other symptoms and signs involving the musculoskeletal system: Secondary | ICD-10-CM | POA: Insufficient documentation

## 2024-02-16 DIAGNOSIS — M79605 Pain in left leg: Secondary | ICD-10-CM | POA: Diagnosis present

## 2024-05-06 ENCOUNTER — Other Ambulatory Visit: Payer: Self-pay | Admitting: Nurse Practitioner

## 2024-05-06 DIAGNOSIS — Z1231 Encounter for screening mammogram for malignant neoplasm of breast: Secondary | ICD-10-CM

## 2024-06-25 ENCOUNTER — Ambulatory Visit
